# Patient Record
Sex: Female | Born: 1970 | Hispanic: Yes | Marital: Single | Smoking: Never smoker
Health system: Southern US, Community
[De-identification: ages and names within clinical notes are randomized; demographics above are authoritative.]

## PROBLEM LIST (undated history)

## (undated) ENCOUNTER — Ambulatory Visit: Admission: EM | Payer: Self-pay

## (undated) DIAGNOSIS — F419 Anxiety disorder, unspecified: Secondary | ICD-10-CM

## (undated) DIAGNOSIS — E785 Hyperlipidemia, unspecified: Secondary | ICD-10-CM

## (undated) HISTORY — PX: OTHER SURGICAL HISTORY: SHX169

---

## 2020-08-21 ENCOUNTER — Ambulatory Visit (INDEPENDENT_AMBULATORY_CARE_PROVIDER_SITE_OTHER): Payer: Self-pay | Admitting: Primary Care

## 2020-11-05 ENCOUNTER — Ambulatory Visit (INDEPENDENT_AMBULATORY_CARE_PROVIDER_SITE_OTHER): Payer: 59

## 2020-11-05 ENCOUNTER — Ambulatory Visit
Admission: EM | Admit: 2020-11-05 | Discharge: 2020-11-05 | Disposition: A | Discharge status: Home / Self Care | Payer: 59

## 2020-11-05 ENCOUNTER — Encounter: Payer: Self-pay | Admitting: Emergency Medicine

## 2020-11-05 ENCOUNTER — Other Ambulatory Visit: Payer: Self-pay

## 2020-11-05 DIAGNOSIS — M25572 Pain in left ankle and joints of left foot: Secondary | ICD-10-CM

## 2020-11-05 DIAGNOSIS — S93402A Sprain of unspecified ligament of left ankle, initial encounter: Secondary | ICD-10-CM

## 2020-11-05 HISTORY — DX: Anxiety disorder, unspecified: F41.9

## 2020-11-05 MED ORDER — IBUPROFEN 600 MG PO TABS: 600.0000 mg | ORAL_TABLET | Freq: Four times a day (QID) | ORAL | 0 refills | Status: DC | PRN

## 2020-11-05 NOTE — ED Triage Notes (Signed)
Stepped in hole with left foot. Rolled left ankle and twisted left knee. Denies feeling a pop. Was able to walk on it after. States most pain is concentrated in ankle. Mild swelling around ankle, bony prominences still present, no obvious injury or bruising.

## 2020-11-05 NOTE — ED Provider Notes (Signed)
[facility]    CSN: [id number] Arrival date & time: 11/05/20  1503      History   Chief Complaint Chief Complaint  Patient presents with   Ankle Injury    HPI Sheila Zhang is a 50 y.o. female.   Sheila Zhang is a 50 y.o. female patient that complains of pain in the lateral aspect of the left ankle with radiation to knee after a twisting injury. Onset of symptoms was around 2 pm earlier today. Patient reports that she stepped in a hole and rolled her ankle. She also twisted her knee. The patient describes pain as throbbing and tingling. Pain severity now is 6 /10. Pain is aggravated by movement and weight bearing. Pain is alleviated by rest. She denies any numbness, tingling, weakness, loss of sensation or loss of motion of the extremity. She is able to ambulate but weight bearing increases the pain. The patient denies other injuries.  Care prior to arrival consisted of acetaminophen, with minimal relief.     Past Medical History:  Diagnosis Date   Anxiety     There are no problems to display for this patient.   History reviewed. No pertinent surgical history.  OB History   No obstetric history on file.      Home Medications    Prior to Admission medications   Medication Sig Start Date End Date Taking? Authorizing Provider  buPROPion (WELLBUTRIN) 100 MG tablet Take 100 mg by mouth 2 (two) times daily.   Yes [provider]  ibuprofen (ADVIL) 600 MG tablet Take 1 tablet (600 mg total) by mouth every 6 (six) hours as needed. 11/05/20  Yes Lurline Idol, FNP    Family History History reviewed. No pertinent family history.  Social History Social History   Tobacco Use   Smoking status: Never   Smokeless tobacco: Never     Allergies   Patient has no known allergies.   Review of Systems Review of Systems  Musculoskeletal:  Positive for arthralgias and gait problem.  All other systems reviewed and are  negative.   Physical Exam Triage Vital Signs ED Triage Vitals [11/05/20 1640]  Enc Vitals Group     BP 138/67     Pulse Rate 71     Resp 16     Temp 98 F (36.7 C)     Temp Source Oral     SpO2 97 %     Weight      Height      Head Circumference      Peak Flow      Pain Score 5     Pain Loc      Pain Edu?      Excl. in GC?    No data found.  Updated Vital Signs BP 138/67 (BP Location: Left Arm)   Pulse 71   Temp 98 F (36.7 C) (Oral)   Resp 16   LMP 11/05/2020   SpO2 97%   Visual Acuity Right Eye Distance:   Left Eye Distance:   Bilateral Distance:    Right Eye Near:   Left Eye Near:    Bilateral Near:     Physical Exam Vitals reviewed.  Constitutional:      General: She is not in acute distress.    Appearance: Normal appearance. She is not toxic-appearing.  HENT:     Head: Normocephalic.  Cardiovascular:     Rate and Rhythm: Normal rate.  Pulmonary:     Effort: Pulmonary effort  is normal.  Musculoskeletal:        General: Normal range of motion.     Cervical back: Normal range of motion and neck supple.     Left knee: No swelling, deformity, effusion, erythema or bony tenderness. Normal range of motion. No tenderness. Normal alignment.     Left ankle: No swelling or deformity. Tenderness present. No lateral malleolus tenderness. Normal range of motion.     Left Achilles Tendon: Normal.     Left foot: Normal.  Skin:    General: Skin is warm and dry.  Neurological:     General: No focal deficit present.     Mental Status: She is alert and oriented to person, place, and time.     UC Treatments / Results  Labs (all labs ordered are listed, but only abnormal results are displayed) Labs Reviewed - No data to display  EKG   Radiology DG Ankle Complete Left  Result Date: 11/05/2020 CLINICAL DATA:  50 year old female with fall and trauma to the left lower extremity. EXAM: LEFT ANKLE COMPLETE - 3+ VIEW; LEFT KNEE - COMPLETE 4+ VIEW COMPARISON:   None. FINDINGS: There is no acute fracture or dislocation. The bones are osteopenic. 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 No joint effusion. The soft tissues are unremarkable. IMPRESSION: No acute fracture or dislocation. Electronically Signed   By: Elgie Collard M.D.   On: 11/05/2020 17:33   DG Knee Complete 4 Views Left  Result Date: 11/05/2020 CLINICAL DATA:  50 year old female with fall and trauma to the left lower extremity. EXAM: LEFT ANKLE COMPLETE - 3+ VIEW; LEFT KNEE - COMPLETE 4+ VIEW COMPARISON:  None. FINDINGS: There is no acute fracture or dislocation. The bones are osteopenic. 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 No joint effusion. The soft tissues are unremarkable. IMPRESSION: No acute fracture or dislocation. Electronically Signed   By: Elgie Collard M.D.   On: 11/05/2020 17:33    Procedures Procedures (including critical care time)  Medications Ordered in UC Medications - No data to display  Initial Impression / Assessment and Plan / UC Course  I have reviewed the triage vital signs and the nursing notes.  Pertinent labs & imaging results that were available during my care of the patient were reviewed by me and considered in my medical decision making (see chart for details).  Clinical Course as of 11/05/20 1817  Wed Nov 05, 2020  1802 DG Ankle Complete Left [SM]  1802 DG Knee Complete 4 Views Left [SM]    Clinical Course User Index [SM] Lurline Idol, FNP    50 yo female presenting with left ankle and left knee pain after a twisting injury that occurred around 2 pm today. XR negative for any acute fracture or abnormality. Likely mild sprain. Expected course discussed. Advised RICE. Ankle splint applied in clinic. Ibuprofen as needed for pain. Follow-up with ortho if needed.   Today's evaluation has revealed no signs of a dangerous process. Discussed diagnosis with patient and/or guardian. Patient and/or guardian aware of their  diagnosis, possible red flag symptoms to watch out for and need for close follow up. Patient and/or guardian understands verbal and written discharge instructions. Patient and/or guardian comfortable with plan and disposition.  Patient and/or guardian has a clear mental status at this time, good insight into illness (after discussion and teaching) and has clear judgment to make decisions regarding their care  This care was provided during an unprecedented National Emergency due  to the Novel Coronavirus (COVID-19) pandemic. COVID-19 infections and transmission risks place heavy strains on healthcare resources.  As this pandemic evolves, our facility, providers, and staff strive to respond fluidly, to remain operational, and to provide care relative to available resources and information. Outcomes are unpredictable and treatments are without well-defined guidelines. Further, the impact of COVID-19 on all aspects of urgent care, including the impact to patients seeking care for reasons other than COVID-19, is unavoidable during this national emergency. At this time of the global pandemic, management of patients has significantly changed, even for non-COVID positive patients given high local and regional COVID volumes at this time requiring high healthcare system and resource utilization. The standard of care for management of both COVID suspected and non-COVID suspected patients continues to change rapidly at the local, regional, national, and global levels. This patient was worked up and treated to the best available but ever changing evidence and resources available at this current time.   Documentation was completed with the aid of voice recognition software. Transcription may contain typographical errors. Final Clinical Impressions(s) / UC Diagnoses   Final diagnoses:  Sprain of left ankle, unspecified ligament, initial encounter   Discharge Instructions   None    ED Prescriptions     Medication Sig  Dispense Auth. Provider   ibuprofen (ADVIL) 600 MG tablet Take 1 tablet (600 mg total) by mouth every 6 (six) hours as needed. 30 tablet Lurline Idol, FNP      PDMP not reviewed this encounter.   Lurline Idol, [place] 11/05/20 [phone]

## 2020-11-11 ENCOUNTER — Ambulatory Visit
Admission: EM | Admit: 2020-11-11 | Discharge: 2020-11-11 | Disposition: A | Discharge status: Home / Self Care | Payer: 59 | Attending: Urgent Care | Admitting: Urgent Care

## 2020-11-11 ENCOUNTER — Other Ambulatory Visit: Payer: Self-pay

## 2020-11-11 DIAGNOSIS — M79605 Pain in left leg: Secondary | ICD-10-CM | POA: Diagnosis not present

## 2020-11-11 DIAGNOSIS — M25572 Pain in left ankle and joints of left foot: Secondary | ICD-10-CM

## 2020-11-11 MED ORDER — TIZANIDINE HCL 4 MG PO TABS: 4.0000 mg | ORAL_TABLET | Freq: Every day | ORAL | 0 refills | Status: DC

## 2020-11-11 MED ORDER — NAPROXEN 500 MG PO TABS: 500.0000 mg | ORAL_TABLET | Freq: Two times a day (BID) | ORAL | 0 refills | Status: DC

## 2020-11-11 NOTE — ED Triage Notes (Signed)
Pt c/o pain and swelling to lower left leg s/p work injury that occurred approx 1 week ago. States her foot twisted right while leg twisted left. States she came to our UC after initial injury and we performed x-ray with pain medication for dc. States pain is not getting better and keeping her up at night. States foot tingles and has shooting achy pain moving up her leg.

## 2020-11-11 NOTE — ED Provider Notes (Signed)
[facility]   MRN: [id number] DOB: 03/15/1971  Subjective:   Sheila Zhang is a 50 y.o. female presenting for 1 week history of persistent left leg pain between her thigh all the way down to her ankle.  Patient suffered a work injury and was seen at our urgent care.  Had negative imaging.  Refer to the details regarding the nature of her injury from previous visit.  Has been using ibuprofen and the ASO wrap.  Feels like its not helping.  Denies any new injuries.  No warmth, erythema, bony deformity.  No current facility-administered medications for this encounter.  Current Outpatient Medications:    buPROPion (WELLBUTRIN) 100 MG tablet, Take 100 mg by mouth 2 (two) times daily., Disp: , Rfl:    ibuprofen (ADVIL) 600 MG tablet, Take 1 tablet (600 mg total) by mouth every 6 (six) hours as needed., Disp: 30 tablet, Rfl: 0   No Known Allergies  Past Medical History:  Diagnosis Date   Anxiety      History reviewed. No pertinent surgical history.  History reviewed. No pertinent family history.  Social History   Tobacco Use   Smoking status: Never   Smokeless tobacco: Never  Substance Use Topics   Alcohol use: Never   Drug use: Never    ROS   Objective:   Vitals: BP 129/86 (BP Location: Left Arm)   Pulse 74   Temp 98.1 F (36.7 C) (Oral)   Resp 18   LMP 11/05/2020 (Approximate)   SpO2 97%   Physical Exam Constitutional:      General: She is not in acute distress.    Appearance: Normal appearance. She is well-developed. She is not ill-appearing, toxic-appearing or diaphoretic.  HENT:     Head: Normocephalic and atraumatic.     Nose: Nose normal.     Mouth/Throat:     Mouth: Mucous membranes are moist.     Pharynx: Oropharynx is clear.  Eyes:     General: No scleral icterus.       Right eye: No discharge.        Left eye: No discharge.     Extraocular Movements: Extraocular movements intact.     Conjunctiva/sclera: Conjunctivae normal.      Pupils: Pupils are equal, round, and reactive to light.  Cardiovascular:     Rate and Rhythm: Normal rate.  Pulmonary:     Effort: Pulmonary effort is normal.  Musculoskeletal:     Left upper leg: Tenderness (Along the lateral distal thigh) present. No swelling, edema, deformity, lacerations or bony tenderness.     Left knee: No swelling, deformity, effusion, erythema, ecchymosis, lacerations, bony tenderness or crepitus. Normal range of motion. No tenderness. Normal alignment and normal patellar mobility.     Left lower leg: Tenderness (Anterior lateral over muscular areas) present. No swelling, deformity, lacerations or bony tenderness. No edema.     Left ankle: No swelling, deformity, ecchymosis or lacerations. No tenderness. Normal range of motion.     Left Achilles Tendon: No tenderness or defects. Thompson's test negative.     Left foot: Normal range of motion and normal capillary refill. No swelling, deformity, laceration, tenderness, bony tenderness or crepitus.  Skin:    General: Skin is warm and dry.  Neurological:     General: No focal deficit present.     Mental Status: She is alert and oriented to person, place, and time.     Motor: No weakness.     Coordination: Coordination normal.  Gait: Gait normal.     Deep Tendon Reflexes: Reflexes normal.  Psychiatric:        Mood and Affect: Mood normal.        Behavior: Behavior normal.        Thought Content: Thought content normal.        Judgment: Judgment normal.    Left ankle wrapped using 4" Ace wrap in figure-8 method.   Assessment and Plan :   PDMP not reviewed this encounter.  1. Left leg pain   2. Acute left ankle pain     Will defer imaging as she had completely normal x-rays.  Recommended stopping the use of the ASO brace and use an Ace wrap instead.  We will switch her from ibuprofen to naproxen.  Use tizanidine as well. Counseled patient on potential for adverse effects with medications  prescribed/recommended today, ER and return-to-clinic precautions discussed, patient verbalized understanding.    Wallis Bamberg, PA-C 11/11/20 1211

## 2021-01-05 ENCOUNTER — Other Ambulatory Visit: Payer: Self-pay

## 2021-01-05 ENCOUNTER — Encounter (INDEPENDENT_AMBULATORY_CARE_PROVIDER_SITE_OTHER): Payer: Self-pay | Admitting: Primary Care

## 2021-01-05 ENCOUNTER — Ambulatory Visit (INDEPENDENT_AMBULATORY_CARE_PROVIDER_SITE_OTHER): Payer: 59 | Admitting: Primary Care

## 2021-01-05 VITALS — BP 116/77 | HR 74 | Temp 97.5°F | Ht 64.0 in | Wt 211.2 lb

## 2021-01-05 DIAGNOSIS — R7303 Prediabetes: Secondary | ICD-10-CM

## 2021-01-05 DIAGNOSIS — Z1211 Encounter for screening for malignant neoplasm of colon: Secondary | ICD-10-CM

## 2021-01-05 DIAGNOSIS — M25572 Pain in left ankle and joints of left foot: Secondary | ICD-10-CM

## 2021-01-05 DIAGNOSIS — N912 Amenorrhea, unspecified: Secondary | ICD-10-CM

## 2021-01-05 DIAGNOSIS — Z87898 Personal history of other specified conditions: Secondary | ICD-10-CM

## 2021-01-05 DIAGNOSIS — Z09 Encounter for follow-up examination after completed treatment for conditions other than malignant neoplasm: Secondary | ICD-10-CM

## 2021-01-05 DIAGNOSIS — F4323 Adjustment disorder with mixed anxiety and depressed mood: Secondary | ICD-10-CM | POA: Diagnosis not present

## 2021-01-05 DIAGNOSIS — E6609 Other obesity due to excess calories: Secondary | ICD-10-CM

## 2021-01-05 DIAGNOSIS — Z79899 Other long term (current) drug therapy: Secondary | ICD-10-CM

## 2021-01-05 DIAGNOSIS — N393 Stress incontinence (female) (male): Secondary | ICD-10-CM

## 2021-01-05 DIAGNOSIS — Z23 Encounter for immunization: Secondary | ICD-10-CM | POA: Diagnosis not present

## 2021-01-05 DIAGNOSIS — Z7689 Persons encountering health services in other specified circumstances: Secondary | ICD-10-CM

## 2021-01-05 DIAGNOSIS — Z6836 Body mass index (BMI) 36.0-36.9, adult: Secondary | ICD-10-CM

## 2021-01-05 LAB — POCT GLYCOSYLATED HEMOGLOBIN (HGB A1C): HbA1c, POC (prediabetic range): 5.7 % (ref 5.7–6.4)

## 2021-01-05 MED ORDER — TIZANIDINE HCL 4 MG PO TABS: 4.0000 mg | ORAL_TABLET | Freq: Every day | ORAL | 0 refills | Status: DC

## 2021-01-05 MED ORDER — BUPROPION HCL 100 MG PO TABS: 100.0000 mg | ORAL_TABLET | Freq: Every day | ORAL | 1 refills | Status: DC

## 2021-01-05 NOTE — Progress Notes (Signed)
New Patient Office Visit  Subjective:  Patient ID: Sheila Zhang, female    DOB: [birth year]  Age: 50 y.o. MRN: [id number]  CC:  Chief Complaint  Patient presents with   New Patient (Initial Visit)    anxiety    HPI Sheila Zhang is a 50 year old female who presents for establishment of care. Recently seen in urgent care on 11/11/20 for Left leg pain Acute left ankle pain. Patient suffered a work injury. She had negative imaging on previous visit in the ED on 11/05/20 reports that she stepped in a hole and rolled her ankle. Advised to : Follow up with [facility] (Specialist); As needed. Relocated from [place] and was followed by psychiatrist and counseling every 2 weeks.  Past Medical History:  Diagnosis Date   Anxiety     No past surgical history on file.  Mother is a diabetic  Gestational diabetes with her last child  in 2013  Social History   Socioeconomic History   Marital status: Single    Spouse name: Not on file   Number of children: Not on file   Years of education: Not on file   Highest education level: Not on file  Occupational History   Not on file  Tobacco Use   Smoking status: Never   Smokeless tobacco: Never  Substance and Sexual Activity   Alcohol use: Never   Drug use: Never   Sexual activity: Not on file  Other Topics Concern   Not on file  Social History Narrative   Not on file   Social Determinants of Health   Financial Resource Strain: Not on file  Food Insecurity: Not on file  Transportation Needs: Not on file  Physical Activity: Not on file  Stress: Not on file  Social Connections: Not on file  Intimate Partner Violence: Not on file    ROS Review of Systems  Musculoskeletal:        Left ankle pain and swelling still has pain and in her calf.  All other systems reviewed and are negative.  Objective:  BP 116/77 (BP Location: Right Arm, Patient Position: Sitting, Cuff Size: Large)   Pulse 74   Temp (!) 97.5 F (36.4  C) (Temporal)   Ht '5\' 4"'  (1.626 m)   Wt 211 lb 3.2 oz (95.8 kg)   SpO2 96%   BMI 36.25 kg/m    Physical Exam General: Obese female ,No apparent distress. Eyes: Extraocular eye movements intact, pupils equal and round. Neck: Supple, trachea midline. Thyroid: No enlargement, mobile without fixation, no tenderness. Cardiovascular: Regular rhythm and rate, no murmur, normal radial pulses. Respiratory: Normal respiratory effort, clear to auscultation. Gastrointestinal: Normal pitch active bowel sounds, nontender abdomen without distention or appreciable hepatomegaly. Neurologic: no tremor Musculoskeletal: Normal muscle tone, no tenderness on palpation of tibia, no excessive thoracic kyphosis. Skin: Appropriate warmth, no visible rash. Mental status: Alert, conversant, speech clear, thought logical, appropriate mood and affect, no hallucinations or delusions evident. Hematologic/lymphatic: No cervical adenopathy, no visible ecchymoses.   Assessment & Plan:  Machele was seen today for new patient (initial visit).  Diagnoses and all orders for this visit:  Hospital discharge follow-up Injury at work seen in Urgent Care and ED recommended ortho f/u with [facility] (Specialist); As needed  Encounter to establish care Establish care with PCP  Adjustment disorder with mixed anxiety and depressed mood Requesting refer to psyche and counseling . Refilled Wellbutrin.  Class 2 obesity due to excess calories without serious comorbidity  with body mass index (BMI) of 36.0 to 36.9 in adult Obesity is 30-39 indicating an excess in caloric intake or underlining conditions. This may lead to other co-morbidities. Lifestyle modifications of diet and exercise may reduce obesity.   Discussed diet and exercise for person with BMI >25. Instructed: You must burn more calories than you eat. Losing 5 percent of your body weight should be considered a success. In the longer term, losing more than 15  percent of your body weight and staying at this weight is an extremely good result. However, keep in mind that even losing 5 percent of your body weight leads to important health benefits, so try not to get discouraged if you're not able to lose more than this. Will recheck weight in 3-6 months.  TSH + free T4  Screening for colon cancer -     Cologuard  History of prediabetes -     POCT glycosylated hemoglobin (Hb A1C)  Amenorrhea CBC- heavy painful menstrual cycles last 7 day. No cycle in 2 months (tubal ligation)  Adjustment disorder with mixed anxiety and depressed mood Refilled Wellbutrin 160m daily per patient  -     Ambulatory referral to Psychiatry  Screening for colon cancer -     Cologuard  Medication management -     [id number]+EGFR  Pain of joint of left ankle and foot Pain is 5/10 increase with walking and back of calf feel like something is pulling and radiates to thigh while sleeping. Interrupting sleep. Tizanidine 448mas needed Refer to ortho  Prediabetes 5.7-6.4 consider prediabetes per ADA guidelines . Monitor foods that are high in carbohydrates are the following rice, potatoes, breads, sugars, and pastas.  Reduction in the intake (eating) will assist in lowering your blood sugars.   Need for immunization against influenza Completed health maintenance   Other orders -     buPROPion (WELLBUTRIN) 100 MG tablet; Take 1 tablet (100 mg total) by mouth daily.   Outpatient Encounter Medications as of 01/05/2021  Medication Sig   [DISCONTINUED] buPROPion (WELLBUTRIN) 100 MG tablet Take 100 mg by mouth 2 (two) times daily.   buPROPion (WELLBUTRIN) 100 MG tablet Take 1 tablet (100 mg total) by mouth daily.   [DISCONTINUED] ibuprofen (ADVIL) 600 MG tablet Take 1 tablet (600 mg total) by mouth every 6 (six) hours as needed.   [DISCONTINUED] naproxen (NAPROSYN) 500 MG tablet Take 1 tablet (500 mg total) by mouth 2 (two) times daily with a meal.   [DISCONTINUED] tiZANidine  (ZANAFLEX) 4 MG tablet Take 1 tablet (4 mg total) by mouth at bedtime.   No facility-administered encounter medications on file as of 01/05/2021.    Follow-up: Return for schedule pap.   MiKerin PernaNP

## 2021-01-05 NOTE — Patient Instructions (Addendum)
Calorie Counting for Weight Loss Calories are units of energy. Your body needs a certain number of calories from food to keep going throughout the day. When you eat or drink more calories than your body needs, your body stores the extra calories mostly as fat. When you eat or drink fewer calories than your body needs, your body burns fat to get the energy it needs. Calorie counting means keeping track of how many calories you eat and drink each day. Calorie counting can be helpful if you need to lose weight. If you eat fewer calories than your body needs, you should lose weight. Ask your health care provider what a healthy weight is for you. For calorie counting to work, you will need to eat the right number of calories each day to lose a healthy amount of weight per week. A dietitian can help you figure out how many calories you need in a day and will suggest ways to reach your calorie goal. A healthy amount of weight to lose each week is usually 1-2 lb (0.5-0.9 kg). This usually means that your daily calorie intake should be reduced by 500-750 calories. Eating 1,200-1,500 calories a day can help most women lose weight. Eating 1,500-1,800 calories a day can help most men lose weight. What do I need to know about calorie counting? Work with your health care provider or dietitian to determine how many calories you should get each day. To meet your daily calorie goal, you will need to: Find out how many calories are in each food that you would like to eat. Try to do this before you eat. Decide how much of the food you plan to eat. Keep a food log. Do this by writing down what you ate and how many calories it had. To successfully lose weight, it is important to balance calorie counting with a healthy lifestyle that includes regular activity. Where do I find calorie information? The number of calories in a food can be found on a Nutrition Facts label. If a food does not have a Nutrition Facts label, try to  look up the calories online or ask your dietitian for help. Remember that calories are listed per serving. If you choose to have more than one serving of a food, you will have to multiply the calories per serving by the number of servings you plan to eat. For example, the label on a package of bread might say that a serving size is 1 slice and that there are 90 calories in a serving. If you eat 1 slice, you will have eaten 90 calories. If you eat 2 slices, you will have eaten 180 calories. How do I keep a food log? After each time that you eat, record the following in your food log as soon as possible: What you ate. Be sure to include toppings, sauces, and other extras on the food. How much you ate. This can be measured in cups, ounces, or number of items. How many calories were in each food and drink. The total number of calories in the food you ate. Keep your food log near you, such as in a pocket-sized notebook or on an app or website on your mobile phone. Some programs will calculate calories for you and show you how many calories you have left to meet your daily goal. What are some portion-control tips? Know how many calories are in a serving. This will help you know how many servings you can have of a certain food.  Use a measuring cup to measure serving sizes. You could also try weighing out portions on a kitchen scale. With time, you will be able to estimate serving sizes for some foods. Take time to put servings of different foods on your favorite plates or in your favorite bowls and cups so you know what a serving looks like. Try not to eat straight from a food's packaging, such as from a bag or box. Eating straight from the package makes it hard to see how much you are eating and can lead to overeating. Put the amount you would like to eat in a cup or on a plate to make sure you are eating the right portion. Use smaller plates, glasses, and bowls for smaller portions and to prevent  overeating. Try not to multitask. For example, avoid watching TV or using your computer while eating. If it is time to eat, sit down at a table and enjoy your food. This will help you recognize when you are full. It will also help you be more mindful of what and how much you are eating. What are tips for following this plan? Reading food labels Check the calorie count compared with the serving size. The serving size may be smaller than what you are used to eating. Check the source of the calories. Try to choose foods that are high in protein, fiber, and vitamins, and low in saturated fat, trans fat, and sodium. Shopping Read nutrition labels while you shop. This will help you make healthy decisions about which foods to buy. Pay attention to nutrition labels for low-fat or fat-free foods. These foods sometimes have the same number of calories or more calories than the full-fat versions. They also often have added sugar, starch, or salt to make up for flavor that was removed with the fat. Make a grocery list of lower-calorie foods and stick to it. Cooking Try to cook your favorite foods in a healthier way. For example, try baking instead of frying. Use low-fat dairy products. Meal planning Use more fruits and vegetables. One-half of your plate should be fruits and vegetables. Include lean proteins, such as chicken, [place], and fish. Lifestyle Each week, aim to do one of the following: 150 minutes of moderate exercise, such as walking. 75 minutes of vigorous exercise, such as running. General information Know how many calories are in the foods you eat most often. This will help you calculate calorie counts faster. Find a way of tracking calories that works for you. Get creative. Try different apps or programs if writing down calories does not work for you. What foods should I eat?  Eat nutritious foods. It is better to have a nutritious, high-calorie food, such as an avocado, than a food with  few nutrients, such as a bag of potato chips. Use your calories on foods and drinks that will fill you up and will not leave you hungry soon after eating. Examples of foods that fill you up are nuts and nut butters, vegetables, lean proteins, and high-fiber foods such as whole grains. High-fiber foods are foods with more than 5 g of fiber per serving. Pay attention to calories in drinks. Low-calorie drinks include water and unsweetened drinks. The items listed above may not be a complete list of foods and beverages you can eat. Contact a dietitian for more information. What foods should I limit? Limit foods or drinks that are not good sources of vitamins, minerals, or protein or that are high in unhealthy fats. These include:  Candy. Other sweets. Sodas, specialty coffee drinks, alcohol, and juice. The items listed above may not be a complete list of foods and beverages you should avoid. Contact a dietitian for more information. How do I count calories when eating out? Pay attention to portions. Often, portions are much larger when eating out. Try these tips to keep portions smaller: Consider sharing a meal instead of getting your own. If you get your own meal, eat only half of it. Before you start eating, ask for a container and put half of your meal into it. When available, consider ordering smaller portions from the menu instead of full portions. Pay attention to your food and drink choices. Knowing the way food is cooked and what is included with the meal can help you eat fewer calories. If calories are listed on the menu, choose the lower-calorie options. Choose dishes that include vegetables, fruits, whole grains, low-fat dairy products, and lean proteins. Choose items that are boiled, broiled, grilled, or steamed. Avoid items that are buttered, battered, fried, or served with cream sauce. Items labeled as crispy are usually fried, unless stated otherwise. Choose water, low-fat milk,  unsweetened iced tea, or other drinks without added sugar. If you want an alcoholic beverage, choose a lower-calorie option, such as a glass of wine or light beer. Ask for dressings, sauces, and syrups on the side. These are usually high in calories, so you should limit the amount you eat. If you want a salad, choose a garden salad and ask for grilled meats. Avoid extra toppings such as bacon, cheese, or fried items. Ask for the dressing on the side, or ask for olive oil and vinegar or lemon to use as dressing. Estimate how many servings of a food you are given. Knowing serving sizes will help you be aware of how much food you are eating at restaurants. Where to find more information Centers for Disease Control and Prevention: FootballExhibition.com.br U.[place]. Department of Agriculture: WrestlingReporter.dk Summary Calorie counting means keeping track of how many calories you eat and drink each day. If you eat fewer calories than your body needs, you should lose weight. A healthy amount of weight to lose per week is usually 1-2 lb (0.5-0.9 kg). This usually means reducing your daily calorie intake by 500-750 calories. The number of calories in a food can be found on a Nutrition Facts label. If a food does not have a Nutrition Facts label, try to look up the calories online or ask your dietitian for help. Use smaller plates, glasses, and bowls for smaller portions and to prevent overeating. Use your calories on foods and drinks that will fill you up and not leave you hungry shortly after a meal. This information is not intended to replace advice given to you by your health care provider. Make sure you discuss any questions you have with your health care provider. Document Revised: 05/10/2019 Document Reviewed: 05/10/2019 Elsevier Patient Education  2022 Elsevier Inc. Kegel Exercises Kegel exercises can help strengthen your pelvic floor muscles. The pelvic floor is a group of muscles that support your rectum, small intestine,  and bladder. In females, pelvic floor muscles also help support the womb (uterus). These muscles help you control the flow of urine and stool. Kegel exercises are painless and simple, and they do not require any equipment. Your provider may suggest Kegel exercises to: Improve bladder and bowel control. Improve sexual response. Improve weak pelvic floor muscles after surgery to remove the uterus (hysterectomy) or pregnancy (females). Improve weak  pelvic floor muscles after prostate gland removal or surgery (males). Kegel exercises involve squeezing your pelvic floor muscles, which are the same muscles you squeeze when you try to stop the flow of urine or keep from passing gas. The exercises can be done while sitting, standing, or lying down, but it is best to vary your position. Exercises How to do Kegel exercises: Squeeze your pelvic floor muscles tight. You should feel a tight lift in your rectal area. If you are a female, you should also feel a tightness in your vaginal area. Keep your stomach, buttocks, and legs relaxed. Hold the muscles tight for up to 10 seconds. Breathe normally. Relax your muscles. Repeat as told by your health care provider. Repeat this exercise daily as told by your health care provider. Continue to do this exercise for at least 4-6 weeks, or for as long as told by your health care provider. You may be referred to a physical therapist who can help you learn more about how to do Kegel exercises. Depending on your condition, your health care provider may recommend: Varying how long you squeeze your muscles. Doing several sets of exercises every day. Doing exercises for several weeks. Making Kegel exercises a part of your regular exercise routine. This information is not intended to replace advice given to you by your health care provider. Make sure you discuss any questions you have with your health care provider. Document Revised: 03/19/2020 Document Reviewed:  11/16/2017 Elsevier Patient Education  2022 Elsevier Inc Use our kegel weights for 15 minutes daily to strengthen your vaginal muscles and reach your personal goals

## 2021-01-06 LAB — CMP14+EGFR
ALT: 15 IU/L (ref 0–32)
AST: 19 IU/L (ref 0–40)
Albumin/Globulin Ratio: 1.3 (ref 1.2–2.2)
Albumin: 3.9 g/dL (ref 3.8–4.8)
Alkaline Phosphatase: 108 IU/L (ref 44–121)
BUN/Creatinine Ratio: 11 (ref 9–23)
BUN: 8 mg/dL (ref 6–24)
Bilirubin Total: <0.2 mg/dL (ref 0.0–1.2)
CO2: 23 mmol/L (ref 20–29)
Calcium: 9.1 mg/dL (ref 8.7–10.2)
Chloride: 103 mmol/L (ref 96–106)
Creatinine, Ser: 0.74 mg/dL (ref 0.57–1.00)
Globulin, Total: 3.1 g/dL (ref 1.5–4.5)
Glucose: 82 mg/dL (ref 65–99)
Potassium: 4.6 mmol/L (ref 3.5–5.2)
Sodium: 137 mmol/L (ref 134–144)
Total Protein: 7 g/dL (ref 6.0–8.5)
eGFR: 100 mL/min/{1.73_m2} (ref >59)

## 2021-01-06 LAB — LIPID PANEL
Chol/HDL Ratio: 4.2 ratio (ref 0.0–4.4)
Cholesterol, Total: 241 mg/dL — ABNORMAL HIGH (ref 100–199)
HDL: 57 mg/dL (ref >39)
LDL Chol Calc (NIH): 137 mg/dL — ABNORMAL HIGH (ref 0–99)
Triglycerides: 264 mg/dL — ABNORMAL HIGH (ref 0–149)
VLDL Cholesterol Cal: 47 mg/dL — ABNORMAL HIGH (ref 5–40)

## 2021-01-06 LAB — CBC WITH DIFFERENTIAL/PLATELET
Basophils Absolute: 0.1 10*3/uL (ref 0.0–0.2)
Basos: 1 %
EOS (ABSOLUTE): 0.1 10*3/uL (ref 0.0–0.4)
Eos: 1 %
Hematocrit: 38.7 % (ref 34.0–46.6)
Hemoglobin: 12.9 g/dL (ref 11.1–15.9)
Immature Grans (Abs): 0 10*3/uL (ref 0.0–0.1)
Immature Granulocytes: 0 %
Lymphocytes Absolute: 3.3 10*3/uL — ABNORMAL HIGH (ref 0.7–3.1)
Lymphs: 35 %
MCH: 28.7 pg (ref 26.6–33.0)
MCHC: 33.3 g/dL (ref 31.5–35.7)
MCV: 86 fL (ref 79–97)
Monocytes Absolute: 0.7 10*3/uL (ref 0.1–0.9)
Monocytes: 7 %
Neutrophils Absolute: 5.1 10*3/uL (ref 1.4–7.0)
Neutrophils: 56 %
Platelets: 401 10*3/uL (ref 150–450)
RBC: 4.49 x10E6/uL (ref 3.77–5.28)
RDW: 13.4 % (ref 11.7–15.4)
WBC: 9.3 10*3/uL (ref 3.4–10.8)

## 2021-01-06 LAB — TSH+FREE T4
Free T4: 0.81 ng/dL — ABNORMAL LOW (ref 0.82–1.77)
TSH: 1.03 u[IU]/mL (ref 0.450–4.500)

## 2021-01-09 ENCOUNTER — Other Ambulatory Visit (INDEPENDENT_AMBULATORY_CARE_PROVIDER_SITE_OTHER): Payer: Self-pay | Admitting: Primary Care

## 2021-01-09 DIAGNOSIS — E782 Mixed hyperlipidemia: Secondary | ICD-10-CM

## 2021-01-09 MED ORDER — ATORVASTATIN CALCIUM 80 MG PO TABS
80.0000 mg | ORAL_TABLET | Freq: Every day | ORAL | 3 refills | Status: DC
Start: 2021-01-09 — End: 2021-12-31

## 2021-01-19 ENCOUNTER — Ambulatory Visit (HOSPITAL_COMMUNITY)
Admission: EM | Admit: 2021-01-19 | Discharge: 2021-01-19 | Disposition: A | Discharge status: Home / Self Care | Payer: 59 | Attending: Emergency Medicine | Admitting: Emergency Medicine

## 2021-01-19 ENCOUNTER — Other Ambulatory Visit: Payer: Self-pay

## 2021-01-19 ENCOUNTER — Encounter (HOSPITAL_COMMUNITY): Payer: Self-pay | Admitting: Emergency Medicine

## 2021-01-19 DIAGNOSIS — N939 Abnormal uterine and vaginal bleeding, unspecified: Secondary | ICD-10-CM | POA: Insufficient documentation

## 2021-01-19 LAB — CBC
HCT: 36.6 % (ref 36.0–46.0)
Hemoglobin: 11.8 g/dL — ABNORMAL LOW (ref 12.0–15.0)
MCH: 28.9 pg (ref 26.0–34.0)
MCHC: 32.2 g/dL (ref 30.0–36.0)
MCV: 89.7 fL (ref 80.0–100.0)
Platelets: 361 10*3/uL (ref 150–400)
RBC: 4.08 MIL/uL (ref 3.87–5.11)
RDW: 14.1 % (ref 11.5–15.5)
WBC: 10.2 10*3/uL (ref 4.0–10.5)
nRBC: 0 % (ref 0.0–0.2)

## 2021-01-19 LAB — POCT URINALYSIS DIPSTICK, ED / UC
Bilirubin Urine: NEGATIVE
Glucose, UA: NEGATIVE mg/dL
Ketones, ur: NEGATIVE mg/dL
Nitrite: NEGATIVE
Protein, ur: 30 mg/dL — AB
Specific Gravity, Urine: 1.02 (ref 1.005–1.030)
Urobilinogen, UA: 0.2 mg/dL (ref 0.0–1.0)
pH: 5.5 (ref 5.0–8.0)

## 2021-01-19 LAB — POC URINE PREG, ED: Preg Test, Ur: NEGATIVE

## 2021-01-19 MED ORDER — IBUPROFEN 600 MG PO TABS: 600.0000 mg | ORAL_TABLET | Freq: Four times a day (QID) | ORAL | 0 refills | Status: DC | PRN

## 2021-01-19 NOTE — ED Provider Notes (Signed)
[facility]    CSN: [id number] Arrival date & time: 01/19/21  1139      History   Chief Complaint Chief Complaint  Patient presents with   Vaginal Bleeding   Abdominal Pain    HPI Sheila Zhang is a 50 y.o. female.   Patient here for evaluation of lower abdominal pain and cramping and vaginal bleeding that has been ongoing since Thursday.  Reports that she had not had her period in approximately 3 months prior to Thursday.  Reports bleeding is heavier than her normal period and states that she is passing clots.  Reports going through 7-8 pads a day.  Reports taking Motrin and Tylenol with minimal symptom relief.  Reports having similar symptoms with periods when she was younger but states that she has not had any issues in years.  Denies any trauma, injury, or other precipitating event.  Denies any specific alleviating or aggravating factors.  Denies any fevers, chest pain, shortness of breath, N/V/D, numbness, tingling, weakness, or headaches.    The history is provided by the patient.  Vaginal Bleeding Associated symptoms: abdominal pain   Abdominal Pain Associated symptoms: vaginal bleeding    Past Medical History:  Diagnosis Date   Anxiety     There are no problems to display for this patient.   History reviewed. No pertinent surgical history.  OB History   No obstetric history on file.      Home Medications    Prior to Admission medications   Medication Sig Start Date End Date Taking? Authorizing Provider  ibuprofen (ADVIL) 600 MG tablet Take 1 tablet (600 mg total) by mouth every 6 (six) hours as needed. 01/19/21  Yes Ivette Loyal, NP  atorvastatin (LIPITOR) 80 MG tablet Take 1 tablet (80 mg total) by mouth daily. 01/09/21   Grayce Sessions, NP  buPROPion (WELLBUTRIN) 100 MG tablet Take 1 tablet (100 mg total) by mouth daily. 01/05/21   Grayce Sessions, NP  tiZANidine (ZANAFLEX) 4 MG tablet Take 1 tablet (4 mg total) by mouth at  bedtime. 01/05/21   Grayce Sessions, NP    Family History History reviewed. No pertinent family history.  Social History Social History   Tobacco Use   Smoking status: Never   Smokeless tobacco: Never  Substance Use Topics   Alcohol use: Never   Drug use: Never     Allergies   Escitalopram   Review of Systems Review of Systems  Gastrointestinal:  Positive for abdominal pain.  Genitourinary:  Positive for menstrual problem and vaginal bleeding.  All other systems reviewed and are negative.   Physical Exam Triage Vital Signs ED Triage Vitals  Enc Vitals Group     BP 01/19/21 1310 125/64     Pulse Rate 01/19/21 1310 77     Resp 01/19/21 1310 18     Temp 01/19/21 1310 98.2 F (36.8 C)     Temp src --      SpO2 01/19/21 1310 99 %     Weight --      Height --      Head Circumference --      Peak Flow --      Pain Score 01/19/21 1306 10     Pain Loc --      Pain Edu? --      Excl. in GC? --    No data found.  Updated Vital Signs BP 125/64   Pulse 77   Temp 98.2 F (36.8  C)   Resp 18   SpO2 99%   Visual Acuity Right Eye Distance:   Left Eye Distance:   Bilateral Distance:    Right Eye Near:   Left Eye Near:    Bilateral Near:     Physical Exam Vitals and nursing note reviewed.  Constitutional:      General: She is not in acute distress.    Appearance: Normal appearance. She is not ill-appearing, toxic-appearing or diaphoretic.  HENT:     Head: Normocephalic and atraumatic.  Eyes:     Conjunctiva/sclera: Conjunctivae normal.  Cardiovascular:     Rate and Rhythm: Normal rate.     Pulses: Normal pulses.  Pulmonary:     Effort: Pulmonary effort is normal.  Abdominal:     General: Abdomen is flat.     Tenderness: There is abdominal tenderness in the suprapubic area.  Genitourinary:    Comments: declines Musculoskeletal:        General: Normal range of motion.     Cervical back: Normal range of motion.  Skin:    General: Skin is warm and  dry.  Neurological:     General: No focal deficit present.     Mental Status: She is alert and oriented to person, place, and time.  Psychiatric:        Mood and Affect: Mood normal.     UC Treatments / Results  Labs (all labs ordered are listed, but only abnormal results are displayed) Labs Reviewed  CBC  POCT URINALYSIS DIPSTICK, ED / UC  POC URINE PREG, ED    EKG   Radiology No results found.  Procedures Procedures (including critical care time)  Medications Ordered in UC Medications - No data to display  Initial Impression / Assessment and Plan / UC Course  I have reviewed the triage vital signs and the nursing notes.  Pertinent labs & imaging results that were available during my care of the patient were reviewed by me and considered in my medical decision making (see chart for details).    Assessment negative for red flags or concerns.  Abnormal vaginal bleeding.  Urinalysis with hemoglobin, protein, and leukocytes.  Pregnancy test negative.  CBC obtained for hemoglobin.  We will follow-up with patient if lab results are abnormal.  Take ibuprofen 3 times a day as needed for pain.  May also take Tylenol.  May use heating pad or warm compresses for comfort.  Patient reports having an appointment with OB/GYN scheduled for Thursday and encouraged to keep follow-up.  Strict ED follow-up for any worsening symptoms. Final Clinical Impressions(s) / UC Diagnoses   Final diagnoses:  Abnormal vaginal bleeding     Discharge Instructions      You can take the Ibuprofen 3 times a day as needed for pain.  You can also take Tylenol as  needed.    You can use heat or warm compresses for comfort.   Follow up with your OB/GYN as scheduled.       ED Prescriptions     Medication Sig Dispense Auth. Provider   ibuprofen (ADVIL) 600 MG tablet Take 1 tablet (600 mg total) by mouth every 6 (six) hours as needed. 30 tablet Ivette Loyal, NP      PDMP not reviewed this  encounter.   Ivette Loyal, NP 01/19/21 [phone]

## 2021-01-19 NOTE — ED Triage Notes (Signed)
Pt is present today with vaginal bleeding, abdominal pain, and lower back pain. . Pt states that she is going through 7-8 pads daily. Pt states that she is also having light headed and dizziness. Pt states that bleeding started Thursday

## 2021-01-19 NOTE — Discharge Instructions (Addendum)
You can take the Ibuprofen 3 times a day as needed for pain.  You can also take Tylenol as  needed.    You can use heat or warm compresses for comfort.   Follow up with your OB/GYN as scheduled.

## 2021-01-20 ENCOUNTER — Encounter (INDEPENDENT_AMBULATORY_CARE_PROVIDER_SITE_OTHER): Payer: Self-pay

## 2021-01-22 ENCOUNTER — Ambulatory Visit (INDEPENDENT_AMBULATORY_CARE_PROVIDER_SITE_OTHER): Payer: 59 | Admitting: Primary Care

## 2021-01-27 ENCOUNTER — Other Ambulatory Visit (INDEPENDENT_AMBULATORY_CARE_PROVIDER_SITE_OTHER): Payer: Self-pay | Admitting: Primary Care

## 2021-01-27 DIAGNOSIS — M25572 Pain in left ankle and joints of left foot: Secondary | ICD-10-CM

## 2021-01-27 NOTE — Telephone Encounter (Signed)
Sent to PCP ?

## 2021-02-10 ENCOUNTER — Encounter (INDEPENDENT_AMBULATORY_CARE_PROVIDER_SITE_OTHER): Payer: Self-pay | Admitting: Primary Care

## 2021-02-10 ENCOUNTER — Other Ambulatory Visit: Payer: Self-pay

## 2021-02-10 ENCOUNTER — Ambulatory Visit (INDEPENDENT_AMBULATORY_CARE_PROVIDER_SITE_OTHER): Payer: 59 | Admitting: Primary Care

## 2021-02-10 ENCOUNTER — Other Ambulatory Visit (HOSPITAL_COMMUNITY)
Admission: RE | Admit: 2021-02-10 | Discharge: 2021-02-10 | Disposition: A | Discharge status: Home / Self Care | Payer: 59 | Source: Ambulatory Visit | Attending: Primary Care | Admitting: Primary Care

## 2021-02-10 VITALS — BP 121/77 | HR 80 | Temp 97.3°F | Ht 64.0 in | Wt 211.4 lb

## 2021-02-10 DIAGNOSIS — Z6836 Body mass index (BMI) 36.0-36.9, adult: Secondary | ICD-10-CM

## 2021-02-10 DIAGNOSIS — Z124 Encounter for screening for malignant neoplasm of cervix: Secondary | ICD-10-CM | POA: Diagnosis not present

## 2021-02-10 DIAGNOSIS — Z1231 Encounter for screening mammogram for malignant neoplasm of breast: Secondary | ICD-10-CM

## 2021-02-10 DIAGNOSIS — E6609 Other obesity due to excess calories: Secondary | ICD-10-CM

## 2021-02-10 DIAGNOSIS — Z113 Encounter for screening for infections with a predominantly sexual mode of transmission: Secondary | ICD-10-CM | POA: Diagnosis present

## 2021-02-10 DIAGNOSIS — N920 Excessive and frequent menstruation with regular cycle: Secondary | ICD-10-CM | POA: Diagnosis not present

## 2021-02-10 DIAGNOSIS — Z23 Encounter for immunization: Secondary | ICD-10-CM

## 2021-02-10 DIAGNOSIS — E782 Mixed hyperlipidemia: Secondary | ICD-10-CM | POA: Diagnosis not present

## 2021-02-10 NOTE — Progress Notes (Signed)
[facility]  WELL-WOMAN PAP Patient name: Sheila Zhang MRN [id number]  Date of birth: 06-Mar-1971 Chief Complaint:   Gynecologic Exam  History of Present Illness:   Sheila Zhang is a 50 y.o. No obstetric history on file. female being seen today for a routine well-woman exam.  She has concerns with anxiety that increased her heart rate > 100 and unable to sleep. Denies stressors. Does not drink caffeine coffee , or soda,or alcohol   The current method of family planning is none.  Patient's last menstrual period was 01/19/2021 (approximate).  Last pap > 2 year in [place]. Results were: normal Mother has cervical cancer  Last mammogram: > 2 year in [place].[facility] Results were: normal. Family h/o breast cancer: Yes maternal grandmother  Last colonoscopy: none cologuard was sent to the house . Family h/o colorectal cancer: No  Review of Systems:    Denies any headaches, blurred vision, fatigue, shortness of breath, chest pain, abdominal pain, abnormal vaginal discharge/itching/odor/irritation, problems with periods, bowel movements, urination, or intercourse unless otherwise stated above.  Pertinent History Reviewed:   Reviewed past medical,surgical, social and family history.  Reviewed problem list, medications and allergies.  Physical Assessment:   Vitals:   02/10/21 1341  BP: 121/77  Pulse: 80  Temp: (!) 97.3 F (36.3 C)  TempSrc: Temporal  SpO2: 96%  Weight: 211 lb 6.4 oz (95.9 kg)  Height: 5\' 4"  (1.626 m)  Body mass index is 36.29 kg/m.        Physical Examination:  General appearance - well appearing, and in no distress Mental status - alert, oriented to person, place, and time Psych:  She has a normal mood and affect Skin - warm and dry, normal color, no suspicious lesions noted Chest - effort normal, all lung fields clear to auscultation bilaterally Heart - normal rate and regular rhythm Neck:  midline trachea, no thyromegaly or nodules Breasts -  breasts appear normal, no suspicious masses, no skin or nipple changes or axillary nodes Educated patient on proper self breast examination and had patient to demonstrate SBE. Abdomen - soft, nontender, nondistended, no masses or organomegaly Pelvic-VULVA: normal appearing vulva with no masses, tenderness or lesions   VAGINA: normal appearing vagina with normal color and discharge, no lesions   CERVIX: normal appearing cervix without discharge or lesions, no CMT UTERUS: uterus is felt to be normal size, shape, consistency and nontender  ADNEXA: No adnexal masses or tenderness noted. Extremities:  No swelling or varicosities noted  No results found for this or any previous visit (from the past 24 hour(s)).   Assessment & Plan:   Orine was seen today for gynecologic exam.  Diagnoses and all orders for this visit:  1) Well-Woman Exam with cervical ancillary and  Pap  2) Breast tenderness  Mammogram   Labs/procedures today:  cervical ancillary and  Pap  Mammogram refer or sooner if problems Colonoscopy colon guard at home  or sooner if problems  Need for Tdap vaccination -     Tdap vaccine greater than or equal to 7yo IM  Cervical cancer screening -     Cervicovaginal ancillary only  Screening for STD (sexually transmitted disease) -     Cytology - PAP([place])  Breast cancer screening by mammogram -     MM Digital Screening; Future   Class 2 obesity due to excess calories without serious comorbidity with body mass index (BMI) of 36.0 to 36.9 in adult Obesity is 30-39 indicating an excess in caloric intake  or underlining conditions. This may lead to other co-morbidities. Lifestyle modifications of diet and exercise may reduce obesity.    Menorrhagia with regular cycle Menorrhalgia heavy  vaginal bleeding with menstrual cycles.   Recently seen in ED for problem  CBC  Meds: No orders of the defined types were placed in this encounter.   Follow-up: Return in  about 3 months (around 05/13/2021), or if symptoms worsen or fail to improve, for fasting .  This note has been created with Education officer, environmental. Any transcriptional errors are unintentional.   Grayce Sessions, NP 02/10/2021, 2:16 PM

## 2021-02-10 NOTE — Patient Instructions (Signed)
Influenza, Adult °Influenza is also called "the flu." It is an infection in the lungs, nose, and throat (respiratory tract). It spreads easily from person to person (is contagious). The flu causes symptoms that are like a cold, along with high fever and body aches. °What are the causes? °This condition is caused by the influenza virus. You can get the virus by: °Breathing in droplets that are in the air after a person infected with the flu coughed or sneezed. °Touching something that has the virus on it and then touching your mouth, nose, or eyes. °What increases the risk? °Certain things may make you more likely to get the flu. These include: °Not washing your hands often. °Having close contact with many people during cold and flu season. °Touching your mouth, eyes, or nose without first washing your hands. °Not getting a flu shot every year. °You may have a higher risk for the flu, and serious problems, such as a lung infection (pneumonia), if you: °Are older than 65. °Are pregnant. °Have a weakened disease-fighting system (immune system) because of a disease or because you are taking certain medicines. °Have a long-term (chronic) condition, such as: °Heart, kidney, or lung disease. °Diabetes. °Asthma. °Have a liver disorder. °Are very overweight (morbidly obese). °Have anemia. °What are the signs or symptoms? °Symptoms usually begin suddenly and last 4-14 days. They may include: °Fever and chills. °Headaches, body aches, or muscle aches. °Sore throat. °Cough. °Runny or stuffy (congested) nose. °Feeling discomfort in your chest. °Not wanting to eat as much as normal. °Feeling weak or tired. °Feeling dizzy. °Feeling sick to your stomach or throwing up. °How is this treated? °If the flu is found early, you can be treated with antiviral medicine. This can help to reduce how bad the illness is and how long it lasts. This may be given by mouth or through an IV tube. °Taking care of yourself at home can help your  symptoms get better. Your doctor may want you to: °Take over-the-counter medicines. °Drink plenty of fluids. °The flu often goes away on its own. If you have very bad symptoms or other problems, you may be treated in a hospital. °Follow these instructions at home: °  °Activity °Rest as needed. Get plenty of sleep. °Stay home from work or school as told by your doctor. °Do not leave home until you do not have a fever for 24 hours without taking medicine. °Leave home only to go to your doctor. °Eating and drinking °Take an ORS (oral rehydration solution). This is a drink that is sold at pharmacies and stores. °Drink enough fluid to keep your pee pale yellow. °Drink clear fluids in small amounts as you are able. Clear fluids include: °Water. °Ice chips. °Fruit juice mixed with water. °Low-calorie sports drinks. °Eat bland foods that are easy to digest. Eat small amounts as you are able. These foods include: °Bananas. °Applesauce. °Rice. °Lean meats. °Toast. °Crackers. °Do not eat or drink: °Fluids that have a lot of sugar or caffeine. °Alcohol. °Spicy or fatty foods. °General instructions °Take over-the-counter and prescription medicines only as told by your doctor. °Use a cool mist humidifier to add moisture to the air in your home. This can make it easier for you to breathe. °When using a cool mist humidifier, clean it daily. Empty water and replace with clean water. °Cover your mouth and nose when you cough or sneeze. °Wash your hands with soap and water often and for at least 20 seconds. This is also important after   you cough or sneeze. If you cannot use soap and water, use alcohol-based hand sanitizer. °Keep all follow-up visits. °How is this prevented? ° °Get a flu shot every year. You may get the flu shot in late summer, fall, or winter. Ask your doctor when you should get your flu shot. °Avoid contact with people who are sick during fall and winter. This is cold and flu season. °Contact a doctor if: °You get  new symptoms. °You have: °Chest pain. °Watery poop (diarrhea). °A fever. °Your cough gets worse. °You start to have more mucus. °You feel sick to your stomach. °You throw up. °Get help right away if you: °Have shortness of breath. °Have trouble breathing. °Have skin or nails that turn a bluish color. °Have very bad pain or stiffness in your neck. °Get a sudden headache. °Get sudden pain in your face or ear. °Cannot eat or drink without throwing up. °These symptoms may represent a serious problem that is an emergency. Get medical help right away. Call your local emergency services (911 in the [place] °Do not wait to see if the symptoms will go away. °Do not drive yourself to the hospital. °Summary °Influenza is also called "the flu." It is an infection in the lungs, nose, and throat. It spreads easily from person to person. °Take over-the-counter and prescription medicines only as told by your doctor. °Getting a flu shot every year is the best way to not get the flu. °This information is not intended to replace advice given to you by your health care provider. Make sure you discuss any questions you have with your health care provider. °Document Revised: 11/16/2019 Document Reviewed: 11/16/2019 °Elsevier Patient Education © 2022 [employer] Inc[employer] ° °

## 2021-02-12 LAB — CERVICOVAGINAL ANCILLARY ONLY
Bacterial Vaginitis (gardnerella): NEGATIVE
Candida Glabrata: NEGATIVE
Candida Vaginitis: NEGATIVE
Chlamydia: NEGATIVE
Comment: NEGATIVE
Comment: NEGATIVE
Comment: NEGATIVE
Comment: NEGATIVE
Comment: NEGATIVE
Comment: NORMAL
Neisseria Gonorrhea: NEGATIVE
Trichomonas: NEGATIVE

## 2021-02-13 LAB — CYTOLOGY - PAP
Comment: NEGATIVE
Diagnosis: NEGATIVE
High risk HPV: NEGATIVE

## 2021-02-16 ENCOUNTER — Ambulatory Visit (INDEPENDENT_AMBULATORY_CARE_PROVIDER_SITE_OTHER): Payer: Self-pay

## 2021-02-16 ENCOUNTER — Telehealth (INDEPENDENT_AMBULATORY_CARE_PROVIDER_SITE_OTHER): Payer: Self-pay

## 2021-02-16 NOTE — Telephone Encounter (Signed)
Patient called for triage regarding heavy menstrual bleeding.  Bleeding started 3 days ago and was very heavy with clots. Bleeding has slowed since then Pt has used 3 pads today.   Pt was seen at urgent care on 10/10 for the same problem. Pt was prescribed Ibuprofen.  Pt was also seen at the office 02/10/2021 for a well women visit.   Per protocol appointment was made within 2 weeks. Pt will call back if bleeding increases for continues more than 7 days.    Reason for Disposition  Periods with > 6 soaked pads or tampons per day  Answer Assessment - Initial Assessment Questions 1. AMOUNT: "Describe the bleeding that you are having."    - SPOTTING: spotting, or pinkish / brownish mucous discharge; does not fill panty liner or pad    - MILD:  less than 1 pad / hour; less than patient's usual menstrual bleeding   - MODERATE: 1-2 pads / hour; 1 menstrual cup every 6 hours; small-medium blood clots (e.g., pea, grape, small coin)   - SEVERE: soaking 2 or more pads/hour for 2 or more hours; 1 menstrual cup every 2 hours; bleeding not contained by pads or continuous red blood from vagina; large blood clots (e.g., golf ball, large coin)      moderate 2. ONSET: "When did the bleeding begin?" "Is it continuing now?"     3 days ago 3. MENSTRUAL PERIOD: "When was the last normal menstrual period?" "How is this different than your period?"     Heavier 4. REGULARITY: "How regular are your periods?"     Reuglar 5. ABDOMINAL PAIN: "Do you have any pain?" "How bad is the pain?"  (e.g., Scale 1-10; mild, moderate, or severe)   - MILD (1-3): doesn't interfere with normal activities, abdomen soft and not tender to touch    - MODERATE (4-7): interferes with normal activities or awakens from sleep, abdomen tender to touch    - SEVERE (8-10): excruciating pain, doubled over, unable to do any normal activities      Mild - Moderate 6. PREGNANCY: "Could you be pregnant?" "Are you sexually active?" "Did you  recently give birth?"      7. BREASTFEEDING: "Are you breastfeeding?"      8. HORMONES: "Are you taking any hormone medications, prescription or OTC?" (e.g., birth control pills, estrogen)     no 9. BLOOD THINNERS: "Do you take any blood thinners?" (e.g., Coumadin/warfarin, Pradaxa/dabigatran, aspirin)     no 10. CAUSE: "What do you think is causing the bleeding?" (e.g., recent gyn surgery, recent gyn procedure; known bleeding disorder, cervical cancer, polycystic ovarian disease, fibroids)          11. HEMODYNAMIC STATUS: "Are you weak or feeling lightheaded?" If Yes, ask: "Can you stand and walk normally?"         12. OTHER SYMPTOMS: "What other symptoms are you having with the bleeding?" (e.g., passed tissue, vaginal discharge, fever, menstrual-type cramps)       cramps  Protocols used: Vaginal Bleeding - Abnormal-A-AH

## 2021-02-16 NOTE — Telephone Encounter (Signed)
-----   Message from Grayce Sessions, NP sent at 02/14/2021  5:03 PM EDT ----- Your pap was NEGATIVE for cancerous cells and HPV (the virus that can cause genital warts). This means that you will not cervical cell sampling (pap smear) until 202025 (in 3 years). However, we recommend you return to our office every year for an annual well-woman exam; including a clinical breast exam. Therefore, we will see you in 1 year.Please call our office for any urgent problems or concerns before your annual exam is due.

## 2021-02-16 NOTE — Telephone Encounter (Signed)
Date of birth verified. Patient aware of negative pap and screenings. Maryjean Morn, CMA

## 2021-02-24 ENCOUNTER — Other Ambulatory Visit (INDEPENDENT_AMBULATORY_CARE_PROVIDER_SITE_OTHER): Payer: Self-pay | Admitting: Primary Care

## 2021-02-24 DIAGNOSIS — M25572 Pain in left ankle and joints of left foot: Secondary | ICD-10-CM

## 2021-02-24 NOTE — Telephone Encounter (Signed)
Sent to PCP ?

## 2021-03-02 ENCOUNTER — Ambulatory Visit (INDEPENDENT_AMBULATORY_CARE_PROVIDER_SITE_OTHER): Payer: 59 | Admitting: Primary Care

## 2021-03-10 ENCOUNTER — Other Ambulatory Visit: Payer: Self-pay

## 2021-03-10 ENCOUNTER — Ambulatory Visit (INDEPENDENT_AMBULATORY_CARE_PROVIDER_SITE_OTHER): Payer: 59 | Admitting: Clinical

## 2021-03-10 DIAGNOSIS — F411 Generalized anxiety disorder: Secondary | ICD-10-CM | POA: Diagnosis not present

## 2021-03-11 ENCOUNTER — Other Ambulatory Visit (INDEPENDENT_AMBULATORY_CARE_PROVIDER_SITE_OTHER): Payer: 59

## 2021-03-14 DIAGNOSIS — F411 Generalized anxiety disorder: Secondary | ICD-10-CM | POA: Insufficient documentation
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
## 2021-03-17 ENCOUNTER — Ambulatory Visit (HOSPITAL_COMMUNITY): Payer: 59 | Admitting: Physician Assistant

## 2021-03-24 ENCOUNTER — Ambulatory Visit (INDEPENDENT_AMBULATORY_CARE_PROVIDER_SITE_OTHER): Payer: 59

## 2021-03-24 ENCOUNTER — Other Ambulatory Visit: Payer: Self-pay

## 2021-03-24 ENCOUNTER — Ambulatory Visit
Admission: EM | Admit: 2021-03-24 | Discharge: 2021-03-24 | Disposition: A | Discharge status: Home / Self Care | Payer: 59 | Attending: Internal Medicine | Admitting: Internal Medicine

## 2021-03-24 DIAGNOSIS — R059 Cough, unspecified: Secondary | ICD-10-CM

## 2021-03-24 DIAGNOSIS — J069 Acute upper respiratory infection, unspecified: Secondary | ICD-10-CM | POA: Diagnosis not present

## 2021-03-24 MED ORDER — BENZONATATE 100 MG PO CAPS: 100.0000 mg | ORAL_CAPSULE | Freq: Three times a day (TID) | ORAL | 0 refills | Status: DC | PRN

## 2021-03-24 NOTE — ED Provider Notes (Signed)
[facility]    CSN: [id number] Arrival date & time: 03/24/21  6294      History   Chief Complaint Chief Complaint  Patient presents with   Cough    HPI Sheila Zhang is a 50 y.o. female.   Patient presents with productive cough and sore throat.  Patient reports that her cough has been persistent since she was diagnosed with flu around the week of Thanksgiving.  Cough is productive with yellow sputum.  Runny nose and sore throat started yesterday.  Child has similar symptoms currently.  Patient denies any known fevers.  Denies chest pain, shortness of breath, nausea, vomiting, diarrhea, abdominal pain, ear pain.  Patient has not taken any medications to help alleviate symptoms.   Cough  Past Medical History:  Diagnosis Date   Anxiety     Patient Active Problem List   Diagnosis Date Noted   Generalized anxiety disorder 03/14/2021    History reviewed. No pertinent surgical history.  OB History   No obstetric history on file.      Home Medications    Prior to Admission medications   Medication Sig Start Date End Date Taking? Authorizing Provider  benzonatate (TESSALON) 100 MG capsule Take 1 capsule (100 mg total) by mouth every 8 (eight) hours as needed for cough. 03/24/21  Yes Brittani Purdum, Rolly Salter E, FNP  atorvastatin (LIPITOR) 80 MG tablet Take 1 tablet (80 mg total) by mouth daily. 01/09/21   Grayce Sessions, NP  buPROPion (WELLBUTRIN) 100 MG tablet Take 1 tablet (100 mg total) by mouth daily. 01/05/21   Grayce Sessions, NP  ibuprofen (ADVIL) 600 MG tablet Take 1 tablet (600 mg total) by mouth every 6 (six) hours as needed. 01/19/21   Ivette Loyal, NP  tiZANidine (ZANAFLEX) 4 MG tablet TAKE 1 TABLET BY MOUTH EVERYDAY AT BEDTIME 02/24/21   Grayce Sessions, NP    Family History History reviewed. No pertinent family history.  Social History Social History   Tobacco Use   Smoking status: Never   Smokeless tobacco: Never  Substance Use  Topics   Alcohol use: Never   Drug use: Never     Allergies   Escitalopram   Review of Systems Review of Systems Per HPI  Physical Exam Triage Vital Signs ED Triage Vitals [03/24/21 0856]  Enc Vitals Group     BP 121/82     Pulse Rate 89     Resp 18     Temp 98 F (36.7 C)     Temp Source Oral     SpO2 96 %     Weight      Height      Head Circumference      Peak Flow      Pain Score 5     Pain Loc      Pain Edu?      Excl. in GC?    No data found.  Updated Vital Signs BP 121/82 (BP Location: Left Arm)    Pulse 89    Temp 98 F (36.7 C) (Oral)    Resp 18    LMP 03/11/2021    SpO2 96%   Visual Acuity Right Eye Distance:   Left Eye Distance:   Bilateral Distance:    Right Eye Near:   Left Eye Near:    Bilateral Near:     Physical Exam Constitutional:      General: She is not in acute distress.    Appearance: Normal appearance.  She is not toxic-appearing or diaphoretic.  HENT:     Head: Normocephalic and atraumatic.     Right Ear: Tympanic membrane and ear canal normal.     Left Ear: Tympanic membrane and ear canal normal.     Nose: Congestion present.     Mouth/Throat:     Mouth: Mucous membranes are moist.     Pharynx: No posterior oropharyngeal erythema.  Eyes:     Extraocular Movements: Extraocular movements intact.     Conjunctiva/sclera: Conjunctivae normal.     Pupils: Pupils are equal, round, and reactive to light.  Cardiovascular:     Rate and Rhythm: Normal rate and regular rhythm.     Pulses: Normal pulses.     Heart sounds: Normal heart sounds.  Pulmonary:     Effort: Pulmonary effort is normal. No respiratory distress.     Breath sounds: Normal breath sounds. No stridor. No wheezing, rhonchi or rales.  Abdominal:     General: Abdomen is flat. Bowel sounds are normal.     Palpations: Abdomen is soft.  Musculoskeletal:        General: Normal range of motion.     Cervical back: Normal range of motion.  Skin:    General: Skin is  warm and dry.  Neurological:     General: No focal deficit present.     Mental Status: She is alert and oriented to person, place, and time. Mental status is at baseline.  Psychiatric:        Mood and Affect: Mood normal.        Behavior: Behavior normal.     UC Treatments / Results  Labs (all labs ordered are listed, but only abnormal results are displayed) Labs Reviewed  COVID-19, FLU A+B NAA    EKG   Radiology DG Chest 2 View  Result Date: 03/24/2021 CLINICAL DATA:  Persistent cough EXAM: CHEST - 2 VIEW COMPARISON:  None. FINDINGS: Cardiomediastinal silhouette is within normal limits. There is no focal airspace consolidation. There is no large pleural effusion. There is no visible pneumothorax. There is no acute osseous abnormality. IMPRESSION: No evidence of acute cardiopulmonary disease. Electronically Signed   By: Caprice Renshaw M.D.   On: 03/24/2021 09:44    Procedures Procedures (including critical care time)  Medications Ordered in UC Medications - No data to display  Initial Impression / Assessment and Plan / UC Course  I have reviewed the triage vital signs and the nursing notes.  Pertinent labs & imaging results that were available during my care of the patient were reviewed by me and considered in my medical decision making (see chart for details).     Patient presents with symptoms likely from a viral upper respiratory infection. Differential includes bacterial pneumonia, sinusitis, allergic rhinitis, covid 19, flu. Do not suspect underlying cardiopulmonary process. Symptoms seem unlikely related to ACS, CHF or COPD exacerbations, pneumonia, pneumothorax. Patient is nontoxic appearing and not in need of emergent medical intervention.  COVID-19 and flu test pending.  Recommended symptom control with over the counter medications.  Benzonatate to take as needed for cough.  Patient offered prednisone due to persistent cough but declined.  Return if symptoms fail to  improve in 1-2 weeks or you develop shortness of breath, chest pain, severe headache. Patient states understanding and is agreeable.  Discharged with PCP followup.  Final Clinical Impressions(s) / UC Diagnoses   Final diagnoses:  Cough, unspecified type  Viral upper respiratory infection     Discharge Instructions  Your chest x-ray was normal.  You have been prescribed a cough medication to take as needed.  Your COVID-19 and flu test is pending.  We will call you if it is positive.     ED Prescriptions     Medication Sig Dispense Auth. Provider   benzonatate (TESSALON) 100 MG capsule Take 1 capsule (100 mg total) by mouth every 8 (eight) hours as needed for cough. 21 capsule [place], Acie Fredrickson, [place]      PDMP not reviewed this encounter.   Gustavus Bryant, [place] 03/24/21 [phone]

## 2021-03-24 NOTE — ED Triage Notes (Signed)
Pt c/o cough and sore throat since yesterday. States had the flu a few weeks ago.

## 2021-03-24 NOTE — Discharge Instructions (Signed)
Your chest x-ray was normal.  You have been prescribed a cough medication to take as needed.  Your COVID-19 and flu test is pending.  We will call you if it is positive.

## 2021-03-25 LAB — COVID-19, FLU A+B NAA
Influenza A, NAA: NOT DETECTED
Influenza B, NAA: NOT DETECTED
SARS-CoV-2, NAA: DETECTED — AB

## 2021-03-26 ENCOUNTER — Other Ambulatory Visit (INDEPENDENT_AMBULATORY_CARE_PROVIDER_SITE_OTHER): Payer: Self-pay | Admitting: Primary Care

## 2021-03-26 DIAGNOSIS — M25572 Pain in left ankle and joints of left foot: Secondary | ICD-10-CM

## 2021-03-26 NOTE — Telephone Encounter (Signed)
Sent to PCP ?

## 2021-04-01 ENCOUNTER — Ambulatory Visit: Payer: 59

## 2021-04-09 ENCOUNTER — Other Ambulatory Visit (INDEPENDENT_AMBULATORY_CARE_PROVIDER_SITE_OTHER): Payer: Self-pay | Admitting: Primary Care

## 2021-04-09 DIAGNOSIS — M25572 Pain in left ankle and joints of left foot: Secondary | ICD-10-CM

## 2021-04-10 NOTE — Telephone Encounter (Signed)
° °  Notes to clinic:  pharmacy requesting change in rx, No Show for 03/11/21 appt, no upcoming appt scheduled.   Requested Prescriptions  Pending Prescriptions Disp Refills   tiZANidine (ZANAFLEX) 4 MG tablet [Pharmacy Med Name: TIZANIDINE HCL 4 MG TABLET] 90 tablet 1    Sig: TAKE 1 TABLET BY MOUTH EVERYDAY AT BEDTIME     Not Delegated - Cardiovascular:  Alpha-2 Agonists - tizanidine Failed - 04/09/2021  7:51 PM      Failed - This refill cannot be delegated      Passed - Valid encounter within last 6 months    Recent Outpatient Visits           1 month ago Need for Tdap vaccination   [facility] Grayce Sessions, NP   3 months ago Hospital discharge follow-up   [facility] Grayce Sessions, NP

## 2021-04-10 NOTE — Telephone Encounter (Signed)
Requested medication (s) are due for refill today:   Provider to review  Requested medication (s) are on the active medication list:   Yes  Future visit scheduled:   No   Last ordered: 03/27/2021 #30, 0 refills  Non delegated refill      A 90 day supply is being requested   Requested Prescriptions  Pending Prescriptions Disp Refills   tiZANidine (ZANAFLEX) 4 MG tablet [Pharmacy Med Name: TIZANIDINE HCL 4 MG TABLET] 90 tablet 1    Sig: TAKE 1 TABLET BY MOUTH EVERYDAY AT BEDTIME     Not Delegated - Cardiovascular:  Alpha-2 Agonists - tizanidine Failed - 04/09/2021  7:51 PM      Failed - This refill cannot be delegated      Passed - Valid encounter within last 6 months    Recent Outpatient Visits           1 month ago Need for Tdap vaccination   [facility] Grayce Sessions, NP   3 months ago Hospital discharge follow-up   [facility] Grayce Sessions, NP

## 2021-04-27 ENCOUNTER — Other Ambulatory Visit: Payer: Self-pay

## 2021-04-27 ENCOUNTER — Ambulatory Visit (INDEPENDENT_AMBULATORY_CARE_PROVIDER_SITE_OTHER): Payer: 59

## 2021-04-27 ENCOUNTER — Ambulatory Visit
Admission: EM | Admit: 2021-04-27 | Discharge: 2021-04-27 | Disposition: A | Discharge status: Home / Self Care | Payer: 59 | Attending: Physician Assistant | Admitting: Physician Assistant

## 2021-04-27 ENCOUNTER — Encounter: Payer: Self-pay | Admitting: Emergency Medicine

## 2021-04-27 DIAGNOSIS — M25522 Pain in left elbow: Secondary | ICD-10-CM

## 2021-04-27 MED ORDER — PREDNISONE 20 MG PO TABS: 40.0000 mg | ORAL_TABLET | Freq: Every day | ORAL | 0 refills | Status: AC

## 2021-04-27 NOTE — ED Provider Notes (Signed)
[facility]    CSN: [id number] Arrival date & time: 04/27/21  0923      History   Chief Complaint Chief Complaint  Patient presents with   Elbow Pain    HPI Sheila Zhang is a 51 y.o. female.   Patient here today for evaluation of left elbow pain and left arm pain that started gradually after she felt a pop in her left elbow on Tuesday when she was lifting a heavy item at work.  She states initially she did not have significant pain but since the accident she has had gradual pain worsen.  She does still have full range of motion of her shoulder, elbow, wrist and fingers.  She reports she does have some tingling to her left fingers that seems to be more constant.  She has tried taking a muscle relaxer without significant relief.  The history is provided by the patient.   Past Medical History:  Diagnosis Date   Anxiety     Patient Active Problem List   Diagnosis Date Noted   Generalized anxiety disorder 03/14/2021    History reviewed. No pertinent surgical history.  OB History   No obstetric history on file.      Home Medications    Prior to Admission medications   Medication Sig Start Date End Date Taking? Authorizing Provider  predniSONE (DELTASONE) 20 MG tablet Take 2 tablets (40 mg total) by mouth daily with breakfast for 5 days. 04/27/21 05/02/21 Yes Tomi Bamberger, PA-C  atorvastatin (LIPITOR) 80 MG tablet Take 1 tablet (80 mg total) by mouth daily. 01/09/21   Grayce Sessions, NP  benzonatate (TESSALON) 100 MG capsule Take 1 capsule (100 mg total) by mouth every 8 (eight) hours as needed for cough. 03/24/21   Gustavus Bryant, FNP  buPROPion (WELLBUTRIN) 100 MG tablet Take 1 tablet (100 mg total) by mouth daily. 01/05/21   Grayce Sessions, NP  ibuprofen (ADVIL) 600 MG tablet Take 1 tablet (600 mg total) by mouth every 6 (six) hours as needed. 01/19/21   Ivette Loyal, NP  tiZANidine (ZANAFLEX) 4 MG tablet TAKE 1 TABLET BY MOUTH EVERYDAY  AT BEDTIME 03/27/21   Grayce Sessions, NP    Family History History reviewed. No pertinent family history.  Social History Social History   Tobacco Use   Smoking status: Never   Smokeless tobacco: Never  Substance Use Topics   Alcohol use: Never   Drug use: Never     Allergies   Escitalopram   Review of Systems Review of Systems  Constitutional:  Negative for chills and fever.  Eyes:  Negative for discharge and redness.  Respiratory:  Negative for shortness of breath.   Gastrointestinal:  Negative for abdominal pain, nausea and vomiting.  Musculoskeletal:  Positive for arthralgias and myalgias. Negative for joint swelling.  Neurological:  Positive for numbness.    Physical Exam Triage Vital Signs ED Triage Vitals  Enc Vitals Group     BP 04/27/21 0920 136/78     Pulse Rate 04/27/21 0920 86     Resp 04/27/21 0920 16     Temp 04/27/21 0920 98.1 F (36.7 C)     Temp Source 04/27/21 0920 Oral     SpO2 04/27/21 0920 96 %     Weight --      Height --      Head Circumference --      Peak Flow --      Pain Score 04/27/21 0926  4     Pain Loc --      Pain Edu? --      Excl. in GC? --    No data found.  Updated Vital Signs BP 136/78 (BP Location: Right Arm)    Pulse 86    Temp 98.1 F (36.7 C) (Oral)    Resp 16    SpO2 96%      Physical Exam Vitals and nursing note reviewed.  Constitutional:      General: She is not in acute distress.    Appearance: Normal appearance. She is not ill-appearing.  HENT:     Head: Normocephalic and atraumatic.  Eyes:     Conjunctiva/sclera: Conjunctivae normal.  Cardiovascular:     Rate and Rhythm: Normal rate.  Pulmonary:     Effort: Pulmonary effort is normal.  Musculoskeletal:     Comments: Full range of motion of left shoulder, elbow, wrist and fingers.  No apparent swelling or erythema to left elbow  Neurological:     Mental Status: She is alert.     Comments: Grip strength 5/5 bilaterally  Psychiatric:         Mood and Affect: Mood normal.        Behavior: Behavior normal.        Thought Content: Thought content normal.     UC Treatments / Results  Labs (all labs ordered are listed, but only abnormal results are displayed) Labs Reviewed - No data to display  EKG   Radiology DG Elbow Complete Left  Result Date: 04/27/2021 CLINICAL DATA:  Twisting injury.  Progressive pain. EXAM: LEFT ELBOW - COMPLETE 3+ VIEW COMPARISON:  None. FINDINGS: No acute fracture or dislocation.  No joint effusion. IMPRESSION: No acute osseous abnormality. Electronically Signed   By: Jeronimo Greaves M.D.   On: 04/27/2021 09:45    Procedures Procedures (including critical care time)  Medications Ordered in UC Medications - No data to display  Initial Impression / Assessment and Plan / UC Course  I have reviewed the triage vital signs and the nursing notes.  Pertinent labs & imaging results that were available during my care of the patient were reviewed by me and considered in my medical decision making (see chart for details).   Xray without concerning findings. Will treat with steroid to decrease any inflammation but recommended follow up with ortho if no improvement over the next week.   Final Clinical Impressions(s) / UC Diagnoses   Final diagnoses:  Left elbow pain   Discharge Instructions   None    ED Prescriptions     Medication Sig Dispense Auth. Provider   predniSONE (DELTASONE) 20 MG tablet Take 2 tablets (40 mg total) by mouth daily with breakfast for 5 days. 10 tablet Tomi Bamberger, PA-C      PDMP not reviewed this encounter.   Tomi Bamberger, PA-C 04/27/21 1111

## 2021-04-27 NOTE — ED Triage Notes (Addendum)
Left elbow popped last Tuesday after lifting heavy items at work. Was not initially in pain, pain increased gradually since then. Has retained full active ROM in shoulder, elbow, wrist, and fingers of left arm. Pain located on the lateral side of her elbow, exacerbated whenever she has to carry something. Reports mild tingling sensation in fingertips since injury. Radial/ulnar pulses palpable, fingers warm. Has been taking tizanidine to treat at night she had left over from a previous ankle problem without improvement.

## 2021-05-04 ENCOUNTER — Encounter (HOSPITAL_COMMUNITY): Payer: Self-pay

## 2021-05-04 ENCOUNTER — Ambulatory Visit (HOSPITAL_COMMUNITY): Payer: 59 | Admitting: Clinical

## 2021-05-05 ENCOUNTER — Ambulatory Visit (HOSPITAL_COMMUNITY): Payer: 59 | Admitting: Clinical

## 2021-05-12 ENCOUNTER — Ambulatory Visit (HOSPITAL_COMMUNITY): Payer: 59 | Admitting: Physician Assistant

## 2021-05-12 ENCOUNTER — Encounter: Payer: Self-pay | Admitting: Orthopedic Surgery

## 2021-05-12 ENCOUNTER — Other Ambulatory Visit: Payer: Self-pay

## 2021-05-12 ENCOUNTER — Ambulatory Visit (INDEPENDENT_AMBULATORY_CARE_PROVIDER_SITE_OTHER): Payer: 59 | Admitting: Orthopedic Surgery

## 2021-05-12 DIAGNOSIS — M7712 Lateral epicondylitis, left elbow: Secondary | ICD-10-CM

## 2021-05-12 MED ORDER — MELOXICAM 7.5 MG PO TABS: 7.5000 mg | ORAL_TABLET | Freq: Every day | ORAL | 0 refills | Status: DC

## 2021-05-12 NOTE — Progress Notes (Signed)
Office Visit Note   Patient: Sheila Zhang           Date of Birth: 16-Jul-1970           MRN: [id number] Visit Date: 05/12/2021              Requested by: Kerin Perna, NP [address] [place],  [place] [address] PCP: Kerin Perna, NP   Assessment & Plan: Visit Diagnoses:  1. Lateral epicondylitis, left elbow     Plan: Patient's history and physical exam seem most consistent with new onset lateral epicondylitis of her elbow.  X-rays taken in the urgent care are reviewed today.  They do not demonstrate any acute bony injury.  There is some evidence of degenerative changes at the ulnohumeral joint but no loose bodies.  Most of her tenderness seems to be directly over the lateral epicondyle and just distal.  She has pain with resisted middle finger extension with the elbow extended.  She denies any mechanical symptoms or subjective instability and has no instability on exam.  Her biceps and triceps tendons are intact with 5-5 strength.   We discussed conservative management including oral anti-inflammatory medication and activity modification at work including no lifting, pushing, pulling, or twisting motions of the left arm.  I can see her back in 6 weeks to see if she is having symptom improvement.  Follow-Up Instructions: No follow-ups on file.   Orders:  No orders of the defined types were placed in this encounter.  No orders of the defined types were placed in this encounter.     Procedures: No procedures performed   Clinical Data: No additional findings.   Subjective: Chief Complaint  Patient presents with   Left Elbow - Pain    RIGHT Handed, had it 45 yrs ago in [place], did PT pain went away, then she lifted something at work and had a pop in elbow, states that she has tingling in finger tips, constant pain, Pain 4-5/10, was seen at [facility]    This is a 51 year old right-hand-dominant female who works at [employer] and presents with left elbow pain  after lifting something heavy at work approximately 2 weeks ago.  She thinks she felt a pop in the lateral aspect of her elbow.  Since then she had pain is centered over the elbow mostly the lateral side.  Her pain is worse with lifting or rotational activities.  She was given a oral steroid for 5 days in the ER which improved her shoulder pain, swelling, and hand numbness but she still has elbow pain.  She denies any instability or mechanical symptoms.  She is a history of lateral epicondylitis that was treated some years ago with physical therapy in [place].     Review of Systems   Objective: Vital Signs: BP 105/72 (BP Location: Left Arm, Patient Position: Sitting)    Pulse 81    Ht 5\' 4"  (1.626 m)    Wt 212 lb (96.2 kg)    BMI 36.39 kg/m   Physical Exam Constitutional:      Appearance: Normal appearance.  Cardiovascular:     Rate and Rhythm: Normal rate.     Pulses: Normal pulses.  Pulmonary:     Effort: Pulmonary effort is normal.  Skin:    General: Skin is warm.     Capillary Refill: Capillary refill takes less than 2 seconds.  Neurological:     Mental Status: She is alert.    Left Elbow  Exam   Tenderness  The patient is experiencing tenderness in the lateral epicondyle.   Range of Motion  The patient has normal left elbow ROM.  Muscle Strength  The patient has normal left elbow strength.  Tests  Varus: negative Valgus: negative Tinel's sign (cubital tunnel): negative  Other  Sensation: normal Pulse: present  Comments:  TTP directly over the lateral epicondyle.  Pain seems to be reproduced with resisted middle finger extension with elbow extended.  Intact biceps tendon with hook test and 5/5 EF and supination with elbow flexed. 5/5 triceps extension.  No TTP at medial epicondyle.  Full ROM without crepitus or pain.      Specialty Comments:  No specialty comments available.  Imaging: No results found.   PMFS History: Patient Active Problem List    Diagnosis Date Noted   Lateral epicondylitis, left elbow 05/12/2021   Generalized anxiety disorder 03/14/2021   Past Medical History:  Diagnosis Date   Anxiety     No family history on file.  No past surgical history on file. Social History   Occupational History   Not on file  Tobacco Use   Smoking status: Never   Smokeless tobacco: Never  Substance and Sexual Activity   Alcohol use: Never   Drug use: Never   Sexual activity: Not on file

## 2021-05-14 ENCOUNTER — Encounter (HOSPITAL_COMMUNITY): Payer: Self-pay | Admitting: Psychiatry

## 2021-05-14 ENCOUNTER — Other Ambulatory Visit: Payer: Self-pay

## 2021-05-14 ENCOUNTER — Ambulatory Visit (INDEPENDENT_AMBULATORY_CARE_PROVIDER_SITE_OTHER): Payer: 59 | Admitting: Psychiatry

## 2021-05-14 VITALS — BP 126/60 | HR 76 | Temp 98.2°F | Ht 64.0 in | Wt 212.0 lb

## 2021-05-14 DIAGNOSIS — F411 Generalized anxiety disorder: Secondary | ICD-10-CM

## 2021-05-14 MED ORDER — VENLAFAXINE HCL ER 37.5 MG PO CP24: 37.5000 mg | ORAL_CAPSULE | Freq: Every day | ORAL | 1 refills | Status: DC

## 2021-05-14 NOTE — Progress Notes (Signed)
Psychiatric Initial Adult Assessment   Patient Identification: Sheila Zhang MRN:  [id number] Date of Evaluation:  05/15/2021 Referral Source: Gwinda PasseMichelle Edwards NP Chief Complaint:   Chief Complaint   Medication Management    Visit Diagnosis:    ICD-10-CM   1. Generalized anxiety disorder  F41.1       History of Present Illness:  Sheila Zhang is a 51 year old female [facility] medication management. Patient reports being diagnosed with anxiety in 2014. Her symptoms are managed with Wellbutrin and she states that her medication regimen is ineffective. Patient reports that she previously was prescribed escitalopram and citalopram, but she had an adverse medication reaction of tongue swelling.   Associated Signs/Symptoms: Depression Symptoms:  difficulty concentrating, anxiety, loss of energy/fatigue, disturbed sleep, (Hypo) Manic Symptoms:  Distractibility, Anxiety Symptoms:  Social Anxiety, Psychotic Symptoms:   none PTSD Symptoms: Negative  Past Psychiatric History: Anxiety  Previous Psychotropic Medications: Yes    Substance Abuse History in the last 12 months:  No.  Consequences of Substance Abuse: Negative  Past Medical History:  Past Medical History:  Diagnosis Date   Anxiety    No past surgical history on file.  Family Psychiatric History: Mother: anxiety, suicidal ideations, depression. Sister: anxiety.  Family History: No family history on file.  Social History:   Social History   Socioeconomic History   Marital status: Single    Spouse name: Not on file   Number of children: Not on file   Years of education: Not on file   Highest education level: Not on file  Occupational History   Not on file  Tobacco Use   Smoking status: Never   Smokeless tobacco: Never  Substance and Sexual Activity   Alcohol use: Never   Drug use: Never   Sexual activity: Not on file  Other Topics Concern    Not on file  Social History Narrative   Not on file   Social Determinants of Health   Financial Resource Strain: Not on file  Food Insecurity: Not on file  Transportation Needs: Not on file  Physical Activity: Not on file  Stress: Not on file  Social Connections: Not on file    Additional Social History: 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 Patient reports living with her boyfriend and her two sons. Patient stated that she has two other adult children (female and female) who live in [place]. Patient states that she has friends for social support. She denies alcohol, tobacco, or illicit drug use. Patient reports she shops and watches movies as hobbies.   Allergies:   Allergies  Allergen Reactions   Escitalopram     Burning sensation     Metabolic Disorder Labs: Lab Results  Component Value Date   HGBA1C 5.7 01/05/2021   No results found for: PROLACTIN Lab Results  Component Value Date   CHOL 241 (H) 01/05/2021   TRIG 264 (H) 01/05/2021   HDL 57 01/05/2021   CHOLHDL 4.2 01/05/2021   LDLCALC 137 (H) 01/05/2021   Lab Results  Component Value Date   TSH 1.030 01/05/2021    Therapeutic Level Labs: No results found for: LITHIUM No results found for: CBMZ No results found for: VALPROATE  Current Medications: Current Outpatient Medications  Medication Sig Dispense Refill   venlafaxine XR (EFFEXOR XR) 37.5 MG 24 hr capsule Take 1 capsule (37.5 mg total) by mouth daily. 30 capsule 1   atorvastatin (  LIPITOR) 80 MG tablet Take 1 tablet (80 mg total) by mouth daily. 90 tablet 3   benzonatate (TESSALON) 100 MG capsule Take 1 capsule (100 mg total) by mouth every 8 (eight) hours as needed for cough. 21 capsule 0   ibuprofen (ADVIL) 600 MG tablet Take 1 tablet (600 mg total) by mouth every 6 (six) hours as needed. 30 tablet 0   meloxicam (MOBIC) 7.5 MG tablet Take 1 tablet (7.5 mg total) by mouth daily. 30 tablet 0   tiZANidine  (ZANAFLEX) 4 MG tablet TAKE 1 TABLET BY MOUTH EVERYDAY AT BEDTIME 30 tablet 0   No current facility-administered medications for this visit.    Musculoskeletal: Strength & Muscle Tone: within normal limits Gait & Station: normal Patient leans: N/A  Psychiatric Specialty Exam: Review of Systems  Psychiatric/Behavioral:  Negative for agitation, behavioral problems, hallucinations and suicidal ideas. The patient is nervous/anxious.   All other systems reviewed and are negative.  Blood pressure 126/60, pulse 76, temperature 98.2 F (36.8 C), height 5\' 4"  (1.626 m), weight 212 lb (96.2 kg), SpO2 100 %.Body mass index is 36.39 kg/m.  General Appearance: Well Groomed  Eye Contact:  Good  Speech:  Clear and Coherent  Volume:  Normal  Mood:  Anxious  Affect:  Appropriate  Thought Process:  Coherent  Orientation:  Full (Time, Place, and Person)  Thought Content:  Logical  Suicidal Thoughts:  No  Homicidal Thoughts:  No  Memory:  Immediate;   Good Recent;   Good Remote;   Good  Judgement:  Good  Insight:  Good  Psychomotor Activity:  Normal  Concentration:  Concentration: Good and Attention Span: Good  Recall:  Good  Fund of Knowledge:Good  Language: Good  Akathisia:  Negative  Handed:  Right  AIMS (if indicated):  not done  Assets:  Housing  ADL's:  Intact  Cognition: WNL  Sleep:  Good   Screenings: GAD-7    Flowsheet Row Office Visit from 05/14/2021 in [facility] from 03/10/2021 in [facility] Office Visit from 02/10/2021 in [facility] Office Visit from 01/05/2021 in [facility]  Total GAD-7 Score 14 15 11 12       PHQ2-9    Flowsheet Row Office Visit from 05/14/2021 in [facility] Office Visit from 02/10/2021 in [facility] Office Visit from 01/05/2021 in [facility]  [facility]  PHQ-2 Total Score 2 2 2   PHQ-9 Total Score 9 6 10       Flowsheet Row Office Visit from 05/14/2021 in [facility] ED from 04/27/2021 in [facility]  ED from 03/24/2021 in [facility] at [facility]   C-SSRS RISK CATEGORY No Risk No Risk No Risk       Assessment and Plan: Sheila Zhang presents to [facility] with reports of an ineffective medication regimen to manage symptoms of anxiety. She reports hypersensitivity to medications and is reluctant to initiate alternative treatment options. Patient is in agreement with initiating a low dose of Effexor following medication teaching. Patient acknowledged understanding of medication teaching. Wellbutrin discontinued during this visit. Effexor 37.5 mg e-scribed to patient's preferred pharmacy. Patient recommended to return to care in six weeks or as needed if symptoms worsen.  Diagnosis/Treatment:    ICD-10-CM   1. Generalized anxiety disorder  F41.1  Medications: Effexor XR 37.5 mg by mouth daily  Meds ordered this encounter  Medications   venlafaxine XR (EFFEXOR XR) 37.5 MG 24 hr capsule    Sig: Take 1 capsule (37.5 mg total) by mouth daily.    Dispense:  30 capsule    Refill:  1    Discontinue Bupropion    Order Specific Question:   Supervising Provider    Answer:   Nelly Rout [[id number]]      Mcneil Sober, NP 2/3/202312:25 AM

## 2021-06-08 ENCOUNTER — Other Ambulatory Visit: Payer: Self-pay | Admitting: Orthopedic Surgery

## 2021-06-11 ENCOUNTER — Encounter (HOSPITAL_COMMUNITY): Payer: Self-pay

## 2021-06-11 ENCOUNTER — Telehealth (HOSPITAL_COMMUNITY): Payer: 59 | Admitting: Psychiatry

## 2021-06-23 ENCOUNTER — Ambulatory Visit: Payer: 59 | Admitting: Orthopedic Surgery

## 2021-06-25 ENCOUNTER — Ambulatory Visit (INDEPENDENT_AMBULATORY_CARE_PROVIDER_SITE_OTHER): Payer: 59 | Admitting: Primary Care

## 2021-06-25 ENCOUNTER — Encounter (INDEPENDENT_AMBULATORY_CARE_PROVIDER_SITE_OTHER): Payer: Self-pay

## 2021-06-26 ENCOUNTER — Ambulatory Visit: Payer: 59 | Admitting: Orthopedic Surgery

## 2021-06-30 ENCOUNTER — Ambulatory Visit (HOSPITAL_COMMUNITY): Payer: 59 | Admitting: Physician Assistant

## 2021-06-30 ENCOUNTER — Telehealth (HOSPITAL_COMMUNITY): Payer: 59 | Admitting: Psychiatry

## 2021-06-30 ENCOUNTER — Encounter (HOSPITAL_COMMUNITY): Payer: Self-pay

## 2021-07-06 ENCOUNTER — Ambulatory Visit (INDEPENDENT_AMBULATORY_CARE_PROVIDER_SITE_OTHER): Payer: 59 | Admitting: Clinical

## 2021-07-06 DIAGNOSIS — F411 Generalized anxiety disorder: Secondary | ICD-10-CM

## 2021-07-06 NOTE — Plan of Care (Signed)
Client was in agreement to the plan. ?

## 2021-07-06 NOTE — Progress Notes (Signed)
?THERAPIST PROGRESS NOTE ?Virtual Visit via Video Note ? ?I connected with Sheila Zhang on 07/06/21 at 10:00 AM EDT by a video enabled telemedicine application and verified that I am speaking with the correct person using two identifiers. ? ?Location: ?Patient: home ?Provider: office ?  ?I discussed the limitations of evaluation and management by telemedicine and the availability of in person appointments. The patient expressed understanding and agreed to proceed. ? ? ?Follow Up Instructions: ?I discussed the assessment and treatment plan with the patient. The patient was provided an opportunity to ask questions and all were answered. The patient agreed with the plan and demonstrated an understanding of the instructions. ?  ?The patient was advised to call back or seek an in-person evaluation if the symptoms worsen or if the condition fails to improve as anticipated. ? ? ? ?Session Time: 16 minutes ? ?Participation Level: Active ? ?Behavioral Response: CasualAlertEuthymic ? ?Type of Therapy: Individual Therapy ? ?Treatment Goals addressed: client will score less than a 5 on the GAD-7  ? ?ProgressTowards Goals: Progressing ? ?Interventions: CBT and Supportive ? ?Summary:  ?Sheila Zhang is a 51 y.o. female who presents for the scheduled session oriented times five, appropriately dressed, and friendly. Client denied hallucinations and delusions. ?Client reported on today she is doing well. Client reported her visits with the psychiatrist have been going and her current medication regimen is working well for her anxiety symptoms. Client reported she stakes her medication at night which is better. Client reported she is doing well at her job with no complaints. Client reported she is eating, sleeping, and doing self grooming and hygiene activities well. ? ?  07/06/2021  ? 10:19 AM 05/14/2021  ? 11:27 AM 03/10/2021  ?  9:22 AM 02/10/2021  ?  1:42 PM  ?GAD 7 : Generalized Anxiety Score  ?Nervous, Anxious, on  Edge 0 1 1 2   ?Control/stop worrying 0 3 3 2   ?Worry too much - different things 0 3 3 2   ?Trouble relaxing 0 2 2 1   ?Restless 0 3 2 1   ?Easily annoyed or irritable 0 1 3 1   ?Afraid - awful might happen 0 1 1 2   ?Total GAD 7 Score 0 14 15 11   ?Anxiety Difficulty Not difficult at all Not difficult at all Somewhat difficult Very difficult  ? ? ? ?Suicidal/Homicidal: Nowithout intent/plan ? ?Therapist Response:  ?Therapist began the appointment asking the client how she has been doing since last seen. ?Therapist used CBT to engage using active listening and positive emotional support. ?Therapist used CBT to engage and ask the client about her the severity of her symptoms compared to medication compliance/ ?Therapist completed GAD-7 with the client. ?Therapist used CBT ask the client to identify her progress with frequency of use with coping skills with continued practice in her daily activity.    ?Client was assigned homework to practice self care. ?Client was scheduled for next appointment. ? ? ? ?Plan: Return again in 5 weeks. ? ?Diagnosis: generalized anxiety disorder ? ?Collaboration of Care: Patient refused AEB none requested by the client at this time. ? ?Patient/Guardian was advised Release of Information must be obtained prior to any record release in order to collaborate their care with an outside provider. Patient/Guardian was advised if they have not already done so to contact the registration department to sign all necessary forms in order for to release information regarding their care.  ? ?Consent: Patient/Guardian gives verbal consent for treatment and assignment of benefits for services  provided during this visit. Patient/Guardian expressed understanding and agreed to proceed.  ? ?Sheila Rhymes Savaya Hakes, LCSW ?07/06/2021 ? ?

## 2021-07-07 ENCOUNTER — Telehealth (INDEPENDENT_AMBULATORY_CARE_PROVIDER_SITE_OTHER): Payer: 59 | Admitting: Psychiatry

## 2021-07-07 ENCOUNTER — Telehealth (HOSPITAL_COMMUNITY): Payer: Self-pay | Admitting: *Deleted

## 2021-07-07 DIAGNOSIS — F411 Generalized anxiety disorder: Secondary | ICD-10-CM

## 2021-07-07 MED ORDER — VENLAFAXINE HCL ER 37.5 MG PO CP24: 37.5000 mg | ORAL_CAPSULE | Freq: Every day | ORAL | 2 refills | Status: DC

## 2021-07-07 NOTE — Telephone Encounter (Signed)
Pharmacy faxed a request for her to have a 90 day supply of her venalfaxine. She has refills available and is not out, sent back to pharmacy a response stating a 90 day supply can be discussed at her next appt with her provider which is on 09/22/21. ?

## 2021-07-07 NOTE — Progress Notes (Signed)
BH MD/PA/NP OP Progress Note ? ?07/07/2021 8:40 AM ?Coralyn Mark  ?MRN:  [id number] ? ?Virtual Visit via Video Note ? ?I connected with Sheila Zhang on 07/07/21 at  8:30 AM EDT by a video enabled telemedicine application and verified that I am speaking with the correct person using two identifiers. ? ?Location: ?Patient: home ?Provider: offsite ?  ?I discussed the limitations of evaluation and management by telemedicine and the availability of in person appointments. The patient expressed understanding and agreed to proceed. ?  ?I discussed the assessment and treatment plan with the patient. The patient was provided an opportunity to ask questions and all were answered. The patient agreed with the plan and demonstrated an understanding of the instructions. ?  ?The patient was advised to call back or seek an in-person evaluation if the symptoms worsen or if the condition fails to improve as anticipated. ? ?I provided 5 minutes of non-face-to-face time during this encounter. ? ? ?Mcneil Sober, NP  ? ?Chief Complaint: Medication management ? ?HPI: Sheila Zhang is a 51 year old female presenting to [facility] behavioral health outpatient for follow-up psychiatric evaluation.  She has a psychiatric history of generalized anxiety disorder and her symptoms are managed with Effexor Exar 37.5 mg daily.  Patient reports medication compliance and states that medications are effective with managing symptoms.  Patient denies adverse effects and denies the need for dose adjustment today.  No medication changes. ? ?Patient is alert and oriented x4, calm, pleasant and willing to engage.  She appears well groomed and dressed appropriately for the weather.  She reports a good mood, sleep and appetite.  Patient denies suicidal or homicidal ideations, paranoia, delusional thought, auditory or visual hallucinations. ? ? ?Visit Diagnosis:  ?  ICD-10-CM   ?1. Generalized anxiety disorder  F41.1   ?  ? ? ?Past  Psychiatric History:  ? ? ?Past Medical History:  ?Past Medical History:  ?Diagnosis Date  ? Anxiety   ? No past surgical history on file. ? ?Family Psychiatric History: None known ? ?Family History: No family history on file. ? ?Social History:  ?Social History  ? ?Socioeconomic History  ? Marital status: Single  ?  Spouse name: Not on file  ? Number of children: Not on file  ? Years of education: Not on file  ? Highest education level: Not on file  ?Occupational History  ? Not on file  ?Tobacco Use  ? Smoking status: Never  ? Smokeless tobacco: Never  ?Substance and Sexual Activity  ? Alcohol use: Never  ? Drug use: Never  ? Sexual activity: Not on file  ?Other Topics Concern  ? Not on file  ?Social History Narrative  ? Not on file  ? ?Social Determinants of Health  ? ?Financial Resource Strain: Not on file  ?Food Insecurity: Not on file  ?Transportation Needs: Not on file  ?Physical Activity: Not on file  ?Stress: Not on file  ?Social Connections: Not on file  ? ? ?Allergies:  ?Allergies  ?Allergen Reactions  ? Escitalopram   ?  Burning sensation   ? ? ?Metabolic Disorder Labs: ?Lab Results  ?Component Value Date  ? HGBA1C 5.7 01/05/2021  ? ?No results found for: PROLACTIN ?Lab Results  ?Component Value Date  ? CHOL 241 (H) 01/05/2021  ? TRIG 264 (H) 01/05/2021  ? HDL 57 01/05/2021  ? CHOLHDL 4.2 01/05/2021  ? LDLCALC 137 (H) 01/05/2021  ? ?Lab Results  ?Component Value Date  ? TSH 1.030 01/05/2021  ? ? ?  Therapeutic Level Labs: ?No results found for: LITHIUM ?No results found for: VALPROATE ?No components found for:  CBMZ ? ?Current Medications: ?Current Outpatient Medications  ?Medication Sig Dispense Refill  ? atorvastatin (LIPITOR) 80 MG tablet Take 1 tablet (80 mg total) by mouth daily. 90 tablet 3  ? benzonatate (TESSALON) 100 MG capsule Take 1 capsule (100 mg total) by mouth every 8 (eight) hours as needed for cough. 21 capsule 0  ? ibuprofen (ADVIL) 600 MG tablet Take 1 tablet (600 mg total) by mouth every  6 (six) hours as needed. 30 tablet 0  ? meloxicam (MOBIC) 7.5 MG tablet TAKE 1 TABLET BY MOUTH EVERY DAY 30 tablet 0  ? tiZANidine (ZANAFLEX) 4 MG tablet TAKE 1 TABLET BY MOUTH EVERYDAY AT BEDTIME 30 tablet 0  ? venlafaxine XR (EFFEXOR XR) 37.5 MG 24 hr capsule Take 1 capsule (37.5 mg total) by mouth daily. 30 capsule 1  ? ?No current facility-administered medications for this visit.  ? ? ? ?Musculoskeletal: ?Strength & Muscle Tone: N/A virtual visit ?Gait & Station: N/A virtual visit ?Patient leans: N/A ? ?Psychiatric Specialty Exam: ?Review of Systems  ?Psychiatric/Behavioral:  Negative for hallucinations, self-injury and suicidal ideas.   ?All other systems reviewed and are negative.  ?There were no vitals taken for this visit.There is no height or weight on file to calculate BMI.  ?General Appearance: Well-groomed  ?Eye Contact: Good  ?Speech: Clear and coherent  ?Volume: Normal  ?Mood: Euthymic  ?Affect: Congruent  ?Thought Process: Goal directed  ?Orientation:  Full (Time, Place, and Person)  ?Thought Content: Logical   ?Suicidal Thoughts:  No  ?Homicidal Thoughts:  No  ?Memory: Good  ?Judgement: Good  ?Insight: Good  ?Psychomotor Activity: N/A  ?Concentration: Good  ?Recall:  Good  ?Fund of Knowledge: Good  ?Language: Good  ?Akathisia:  NA  ?Handed:  Right  ?AIMS (if indicated): not done  ?Assets:  Communication Skills ?Desire for Improvement  ?ADL's:  Intact  ?Cognition: WNL  ?Sleep:  Good  ? ?Screenings: ?GAD-7   ? ?Flowsheet Row Counselor from 07/06/2021 in [facility] Office Visit from 05/14/2021 in [facility] Counselor from 03/10/2021 in [facility] Office Visit from 02/10/2021 in [facility] Office Visit from 01/05/2021 in [facility]  ?Total GAD-7 Score 0 14 15 11 12   ? ?  ? ?PHQ2-9   ? ?Flowsheet Row Office Visit from 05/14/2021 in [facility]  Office Visit from 02/10/2021 in [facility] Office Visit from 01/05/2021 in [facility]  ?PHQ-2 Total Score 2 2 2   ?PHQ-9 Total Score 9 6 10   ? ?  ? ?Flowsheet Row Office Visit from 05/14/2021 in [facility] ED from 04/27/2021 in [facility] at [facility]  ED from 03/24/2021 in [facility] Care at [facility]   ?C-SSRS RISK CATEGORY No Risk No Risk No Risk  ? ?  ? ? ? ?Assessment and Plan: Haruye Lainez is a 51 year old female presenting to [facility] behavioral health outpatient for follow-up psychiatric evaluation.  She has a psychiatric history of generalized anxiety disorder and her symptoms are managed with Effexor Exar 37.5 mg daily.  Patient reports medication compliance and states that medications are effective with managing symptoms.  Patient denies adverse effects and denies the need for dose adjustment today.  No medication changes.  Effexor  refilled at current dosage. ? ?Collaboration of Care: Collaboration of Care: Medication Management AEB medication E scribed to patient's preferred pharmacy. ? ? ?1. Generalized anxiety disorder ? ?- venlafaxine XR (EFFEXOR XR) 37.5 MG 24 hr capsule; Take 1 capsule (37.5 mg total) by mouth daily.  Dispense: 30 capsule; Refill: 2  ? ? ?Return to care in 3 months ? ? ? ?Patient/Guardian was advised Release of Information must be obtained prior to any record release in order to collaborate their care with an outside provider. Patient/Guardian was advised if they have not already done so to contact the registration department to sign all necessary forms in order for us to release information regarding their care.  ? ?Consent: Patient/Guardian gives verbal consent for treatment and assignment of benefits for services provided during this visit. Patient/Guardian expressed understanding and agreed to proceed.  ? ? ?Mcneil Sobericely Dameshia Seybold, NP ?07/07/2021, 8:40 AM ? ?

## 2021-09-06 ENCOUNTER — Encounter: Payer: Self-pay | Admitting: Emergency Medicine

## 2021-09-06 ENCOUNTER — Other Ambulatory Visit: Payer: Self-pay

## 2021-09-06 ENCOUNTER — Ambulatory Visit
Admission: EM | Admit: 2021-09-06 | Discharge: 2021-09-06 | Disposition: A | Discharge status: Home / Self Care | Payer: 59 | Attending: Emergency Medicine | Admitting: Emergency Medicine

## 2021-09-06 DIAGNOSIS — R300 Dysuria: Secondary | ICD-10-CM | POA: Insufficient documentation

## 2021-09-06 DIAGNOSIS — J309 Allergic rhinitis, unspecified: Secondary | ICD-10-CM | POA: Insufficient documentation

## 2021-09-06 LAB — POCT URINALYSIS DIP (MANUAL ENTRY)
Bilirubin, UA: NEGATIVE
Blood, UA: NEGATIVE
Glucose, UA: NEGATIVE mg/dL
Ketones, POC UA: NEGATIVE mg/dL
Leukocytes, UA: NEGATIVE
Nitrite, UA: NEGATIVE
Protein Ur, POC: NEGATIVE mg/dL
Spec Grav, UA: >=1.03 — AB (ref 1.010–1.025)
Urobilinogen, UA: 0.2 E.U./dL
pH, UA: 5.5 (ref 5.0–8.0)

## 2021-09-06 LAB — POCT URINE PREGNANCY: Preg Test, Ur: NEGATIVE

## 2021-09-06 MED ORDER — CETIRIZINE HCL 10 MG PO TABS: 10.0000 mg | ORAL_TABLET | Freq: Every day | ORAL | 2 refills | Status: DC

## 2021-09-06 MED ORDER — FLUTICASONE PROPIONATE 50 MCG/ACT NA SUSP: 1.0000 | Freq: Every day | NASAL | 1 refills | Status: DC

## 2021-09-06 MED ORDER — SULFAMETHOXAZOLE-TRIMETHOPRIM 800-160 MG PO TABS: 1.0000 | ORAL_TABLET | Freq: Two times a day (BID) | ORAL | 0 refills | Status: AC

## 2021-09-06 NOTE — ED Triage Notes (Signed)
Pt sts cough x 2 weeks not improving; pt sts started after cleaning her rugs; pt sts dysuria x 2 days

## 2021-09-06 NOTE — ED Provider Notes (Signed)
[facility]    CSN: [id number] Arrival date & time: 09/06/21  1221    HISTORY   Chief Complaint  Patient presents with   Cough   Dysuria   HPI Sheila Zhang is a 51 y.o. female. Pt presents to urgent care today complaining of cough x 2 weeks which is not improving; pt sts her symptoms started after cleaning her rugs.  Patient endorses a history of allergies, not currently taking any allergy medication.  States cough is not productive, worse at night.  Patient denies shortness of breath, fever, aches, chills, nausea, vomiting, diarrhea, sore throat, known sick contacts.  Patient complains of burning with urination for the past 2 days, denies increased frequency of urination, urgency, hesitancy, incomplete emptying.  Patient states she has had similar symptoms in the past.  The history is provided by the patient.  Past Medical History:  Diagnosis Date   Anxiety    Patient Active Problem List   Diagnosis Date Noted   Lateral epicondylitis, left elbow 05/12/2021   Generalized anxiety disorder 03/14/2021   History reviewed. No pertinent surgical history. OB History   No obstetric history on file.    Home Medications    Prior to Admission medications   Medication Sig Start Date End Date Taking? Authorizing Provider  atorvastatin (LIPITOR) 80 MG tablet Take 1 tablet (80 mg total) by mouth daily. 01/09/21   Grayce Sessions, NP  benzonatate (TESSALON) 100 MG capsule Take 1 capsule (100 mg total) by mouth every 8 (eight) hours as needed for cough. Patient not taking: Reported on 09/06/2021 03/24/21   Gustavus Bryant, FNP  ibuprofen (ADVIL) 600 MG tablet Take 1 tablet (600 mg total) by mouth every 6 (six) hours as needed. 01/19/21   Ivette Loyal, NP  meloxicam (MOBIC) 7.5 MG tablet TAKE 1 TABLET BY MOUTH EVERY DAY 06/08/21   Marlyne Beards, MD  tiZANidine (ZANAFLEX) 4 MG tablet TAKE 1 TABLET BY MOUTH EVERYDAY AT BEDTIME 03/27/21   Grayce Sessions, NP   venlafaxine XR (EFFEXOR XR) 37.5 MG 24 hr capsule Take 1 capsule (37.5 mg total) by mouth daily. 07/07/21 07/07/22  Mcneil Sober, NP   Family History History reviewed. No pertinent family history. Social History Social History   Tobacco Use   Smoking status: Never   Smokeless tobacco: Never  Substance Use Topics   Alcohol use: Never   Drug use: Never   Allergies   Escitalopram  Review of Systems Review of Systems Pertinent findings noted in history of present illness.   Physical Exam Triage Vital Signs ED Triage Vitals  Enc Vitals Group     BP 02/06/21 0827 (!) 147/82     Pulse Rate 02/06/21 0827 72     Resp 02/06/21 0827 18     Temp 02/06/21 0827 98.3 F (36.8 C)     Temp Source 02/06/21 0827 Oral     SpO2 02/06/21 0827 98 %     Weight --      Height --      Head Circumference --      Peak Flow --      Pain Score 02/06/21 0826 5     Pain Loc --      Pain Edu? --      Excl. in GC? --   No data found.  Updated Vital Signs BP 130/85 (BP Location: Left Arm)   Pulse 92   Temp 98.1 F (36.7 C) (Oral)   Resp 18  SpO2 95%   Physical Exam Vitals and nursing note reviewed.  Constitutional:      General: She is not in acute distress.    Appearance: Normal appearance. She is not ill-appearing.  HENT:     Head: Normocephalic and atraumatic.     Salivary Glands: Right salivary gland is not diffusely enlarged or tender. Left salivary gland is not diffusely enlarged or tender.     Right Ear: Ear canal and external ear normal. No drainage. A middle ear effusion is present. There is no impacted cerumen. Tympanic membrane is bulging. Tympanic membrane is not injected or erythematous.     Left Ear: Ear canal and external ear normal. No drainage. A middle ear effusion is present. There is no impacted cerumen. Tympanic membrane is bulging. Tympanic membrane is not injected or erythematous.     Ears:     Comments: Bilateral EACs normal, both TMs bulging with clear fluid     Nose: Rhinorrhea present. No nasal deformity, septal deviation, signs of injury, nasal tenderness, mucosal edema or congestion. Rhinorrhea is clear.     Right Nostril: Occlusion present. No foreign body, epistaxis or septal hematoma.     Left Nostril: Occlusion present. No foreign body, epistaxis or septal hematoma.     Right Turbinates: Enlarged, swollen and pale.     Left Turbinates: Enlarged, swollen and pale.     Right Sinus: No maxillary sinus tenderness or frontal sinus tenderness.     Left Sinus: No maxillary sinus tenderness or frontal sinus tenderness.     Mouth/Throat:     Lips: Pink. No lesions.     Mouth: Mucous membranes are moist. No oral lesions.     Pharynx: Oropharynx is clear. Uvula midline. No posterior oropharyngeal erythema or uvula swelling.     Tonsils: No tonsillar exudate. 0 on the right. 0 on the left.     Comments: Postnasal drip Eyes:     General: Lids are normal.        Right eye: No discharge.        Left eye: No discharge.     Extraocular Movements: Extraocular movements intact.     Conjunctiva/sclera: Conjunctivae normal.     Right eye: Right conjunctiva is not injected.     Left eye: Left conjunctiva is not injected.  Neck:     Trachea: Trachea and phonation normal.  Cardiovascular:     Rate and Rhythm: Normal rate and regular rhythm.     Pulses: Normal pulses.     Heart sounds: Normal heart sounds. No murmur heard.   No friction rub. No gallop.  Pulmonary:     Effort: Pulmonary effort is normal. No accessory muscle usage, prolonged expiration or respiratory distress.     Breath sounds: Normal breath sounds. No stridor, decreased air movement or transmitted upper airway sounds. No decreased breath sounds, wheezing, rhonchi or rales.  Chest:     Chest wall: No tenderness.  Abdominal:     General: Abdomen is flat. Bowel sounds are normal. There is no distension.     Palpations: Abdomen is soft. There is no mass.     Tenderness: There is no abdominal  tenderness. There is no right CVA tenderness, left CVA tenderness, guarding or rebound.     Hernia: No hernia is present.  Musculoskeletal:        General: Normal range of motion.     Cervical back: Normal range of motion and neck supple. Normal range of motion.  Lymphadenopathy:  Cervical: No cervical adenopathy.  Skin:    General: Skin is warm and dry.     Findings: No erythema or rash.  Neurological:     General: No focal deficit present.     Mental Status: She is alert and oriented to person, place, and time.  Psychiatric:        Mood and Affect: Mood normal.        Behavior: Behavior normal.    Visual Acuity Right Eye Distance:   Left Eye Distance:   Bilateral Distance:    Right Eye Near:   Left Eye Near:    Bilateral Near:     UC Couse / Diagnostics / Procedures:    EKG  Radiology No results found.  Procedures Procedures (including critical care time)  UC Diagnoses / Final Clinical Impressions(s)   I have reviewed the triage vital signs and the nursing notes.  Pertinent labs & imaging results that were available during my care of the patient were reviewed by me and considered in my medical decision making (see chart for details).    Final diagnoses:  Dysuria  Allergic rhinitis, unspecified seasonality, unspecified trigger   Urine dip today was normal.  Urine culture will be performed due to patient having active symptoms of urinary tract infection.   Patient was advised to begin antibiotics today due to having active symptoms of urinary tract infection.                      Patient was advised to take all doses exactly as prescribed.  Patient also advised of risks of worsening infection with incomplete antibiotic therapy.   Patient advised that they will be contacted with results and that adjustments to treatment will be provided as indicated based on the results.   Patient was advised of possibility that urine culture results may be negative if sample  provided was obtained late in the day causing urine to be more diluted.  Patient was advised that if antibiotics were effective after the first 24 to 36 hours, despite negative urine culture result, it is recommended that they complete the full course as prescribed.     Patient advised that they will be contacted with results of the urine culture and that treatment will be provided as indicated based on results.   Return precautions advised.  ED Prescriptions     Medication Sig Dispense Auth. Provider   sulfamethoxazole-trimethoprim (BACTRIM DS) 800-160 MG tablet Take 1 tablet by mouth 2 (two) times daily for 3 days. 6 tablet Lynden Oxford Scales, PA-C   cetirizine (ZYRTEC ALLERGY) 10 MG tablet Take 1 tablet (10 mg total) by mouth at bedtime. 30 tablet Lynden Oxford Scales, PA-C   fluticasone (FLONASE) 50 MCG/ACT nasal spray Place 1 spray into both nostrils daily. Begin by using 2 sprays in each nare daily for 3 to 5 days, then decrease to 1 spray in each nare daily. 32 mL Lynden Oxford Scales, PA-C      PDMP not reviewed this encounter.  Pending results:  Labs Reviewed  POCT URINALYSIS DIP (MANUAL ENTRY) - Abnormal; Notable for the following components:      Result Value   Spec Grav, UA >=1.030 (*)    All other components within normal limits  URINE CULTURE  POCT URINE PREGNANCY    Medications Ordered in UC: Medications - No data to display  Disposition Upon Discharge:  Condition: stable for discharge home  Patient presented with concern for an acute illness  with associated systemic symptoms and significant discomfort requiring urgent management. In my opinion, this is a condition that a prudent lay person (someone who possesses an average knowledge of health and medicine) may potentially expect to result in complications if not addressed urgently such as respiratory distress, impairment of bodily function or dysfunction of bodily organs.   As such, the patient has been  evaluated and assessed, work-up was performed and treatment was provided in alignment with urgent care protocols and evidence based medicine.  Patient/parent/caregiver has been advised that the patient may require follow up for further testing and/or treatment if the symptoms continue in spite of treatment, as clinically indicated and appropriate.  Routine symptom specific, illness specific and/or disease specific instructions were discussed with the patient and/or caregiver at length.  Prevention strategies for avoiding STD exposure were also discussed.  The patient will follow up with their current PCP if and as advised. If the patient does not currently have a PCP we will assist them in obtaining one.   The patient may need specialty follow up if the symptoms continue, in spite of conservative treatment and management, for further workup, evaluation, consultation and treatment as clinically indicated and appropriate.  Patient/parent/caregiver verbalized understanding and agreement of plan as discussed.  All questions were addressed during visit.  Please see discharge instructions below for further details of plan.  Discharge Instructions:   Discharge Instructions      Your symptoms and my physical exam findings are concerning for exacerbation of your underlying allergies.  It is important that you begin your allergy regimen now and are consistent with taking allergy medications exactly as prescribed.  Allergy medications are preventative and therefore only work well when they are taken daily, not "as needed".   Please see the list below for recommended medications, dosages and frequencies to provide relief of current symptoms:     Zyrtec (cetirizine): This is an excellent second-generation antihistamine that helps to reduce respiratory inflammatory response to environmental allergens.  In some patients, this medication can cause daytime sleepiness so I recommend that you take 1 tablet daily at  bedtime.     Flonase (fluticasone): This is a steroid nasal spray that you use once daily, 1 spray in each nare.  This medication does not work well if you decide to use it only used as you feel you need to, it works best used on a daily basis.  After 3 to 5 days of use, you will notice significant reduction of the inflammation and mucus production that is currently being caused by exposure to allergens, whether seasonal or environmental.  The most common side effect of this medication is nosebleeds.  If you experience a nosebleed, please discontinue use for 1 week, then feel free to resume.  I have provided you with a prescription.     If you find that you have not had improvement of your symptoms in the next 5 to 7 days, please follow-up with your primary care provider or return here to urgent care for repeat evaluation and further recommendations.  The urinalysis that we performed in the clinic today was normal.  Because you are having symptoms, urine culture will be performed.  The results of the urine culture will be available in the next 3 to 5 days and will be posted to your MyChart account.  If there is an abnormal finding, you will be contacted by phone and advised of further treatment recommendations, if any.   You were advised to begin  antibiotics today because you are having active symptoms of a urinary tract infection.  It is very important that you take all doses exactly as prescribed.  Incomplete antibiotic therapy can cause worsening urinary tract infection that can become aggressive, reach the level of your kidneys causing kidney infection and possible hospitalization.   If you receive a phone call advising you that your urine culture is negative but you are feeling significantly better after starting antibiotics, I recommend that you complete the full course of antibiotics as prescribed.     If you receive a phone call advising you that your urine culture is negative and you are not  feeling significantly better after starting antibiotics, please feel free to discontinue antibiotics as they are no longer indicated.   If you have not had complete resolution of your symptoms after completing treatment as prescribed, please return to urgent care for repeat evaluation or follow-up with your primary care provider.   Thank you for visiting urgent care today.  We appreciate the opportunity to participate in your care.       This office note has been dictated using Museum/gallery curator.  Unfortunately, and despite my best efforts, this method of dictation can sometimes lead to occasional typographical or grammatical errors.  I apologize in advance if this occurs.      Lynden Oxford Scales, [place] 09/07/21 [phone]

## 2021-09-06 NOTE — Discharge Instructions (Signed)
Your symptoms and my physical exam findings are concerning for exacerbation of your underlying allergies.  It is important that you begin your allergy regimen now and are consistent with taking allergy medications exactly as prescribed.  Allergy medications are preventative and therefore only work well when they are taken daily, not "as needed".   Please see the list below for recommended medications, dosages and frequencies to provide relief of current symptoms:     Zyrtec (cetirizine): This is an excellent second-generation antihistamine that helps to reduce respiratory inflammatory response to environmental allergens.  In some patients, this medication can cause daytime sleepiness so I recommend that you take 1 tablet daily at bedtime.     Flonase (fluticasone): This is a steroid nasal spray that you use once daily, 1 spray in each nare.  This medication does not work well if you decide to use it only used as you feel you need to, it works best used on a daily basis.  After 3 to 5 days of use, you will notice significant reduction of the inflammation and mucus production that is currently being caused by exposure to allergens, whether seasonal or environmental.  The most common side effect of this medication is nosebleeds.  If you experience a nosebleed, please discontinue use for 1 week, then feel free to resume.  I have provided you with a prescription.     If you find that you have not had improvement of your symptoms in the next 5 to 7 days, please follow-up with your primary care provider or return here to urgent care for repeat evaluation and further recommendations.  The urinalysis that we performed in the clinic today was normal.  Because you are having symptoms, urine culture will be performed.  The results of the urine culture will be available in the next 3 to 5 days and will be posted to your MyChart account.  If there is an abnormal finding, you will be contacted by phone and advised of  further treatment recommendations, if any.   You were advised to begin antibiotics today because you are having active symptoms of a urinary tract infection.  It is very important that you take all doses exactly as prescribed.  Incomplete antibiotic therapy can cause worsening urinary tract infection that can become aggressive, reach the level of your kidneys causing kidney infection and possible hospitalization.   If you receive a phone call advising you that your urine culture is negative but you are feeling significantly better after starting antibiotics, I recommend that you complete the full course of antibiotics as prescribed.     If you receive a phone call advising you that your urine culture is negative and you are not feeling significantly better after starting antibiotics, please feel free to discontinue antibiotics as they are no longer indicated.   If you have not had complete resolution of your symptoms after completing treatment as prescribed, please return to urgent care for repeat evaluation or follow-up with your primary care provider.   Thank you for visiting urgent care today.  We appreciate the opportunity to participate in your care.

## 2021-09-07 LAB — URINE CULTURE: Culture: <10000 — AB

## 2021-09-09 ENCOUNTER — Ambulatory Visit (INDEPENDENT_AMBULATORY_CARE_PROVIDER_SITE_OTHER): Payer: 59 | Admitting: Primary Care

## 2021-09-10 ENCOUNTER — Encounter (HOSPITAL_COMMUNITY): Payer: Self-pay

## 2021-09-10 ENCOUNTER — Ambulatory Visit (HOSPITAL_COMMUNITY): Payer: Medicaid Other | Admitting: Clinical

## 2021-09-11 ENCOUNTER — Telehealth (INDEPENDENT_AMBULATORY_CARE_PROVIDER_SITE_OTHER): Payer: Self-pay | Admitting: Primary Care

## 2021-09-11 NOTE — Telephone Encounter (Signed)
Spoke with patient someone already provided the number to her and she has rescheduled her appt.

## 2021-09-11 NOTE — Telephone Encounter (Signed)
Copied from Bee [id number]. Topic: General - Other >> Sep 11, 2021  8:58 AM Camille Bal, Gerlene Burdock wrote: Reason for CRM:pt called in trying to get number for [facility] counseling, referred by Dr Oletta Lamas, she was supposed to have video visit yesterday ,06/01 that she missed. Please call back with phone number if possible to reschedule. She couldn't remember the name or anything of the provider.

## 2021-09-22 ENCOUNTER — Encounter (HOSPITAL_COMMUNITY): Payer: Self-pay | Admitting: Psychiatry

## 2021-09-22 ENCOUNTER — Telehealth (INDEPENDENT_AMBULATORY_CARE_PROVIDER_SITE_OTHER): Payer: 59 | Admitting: Psychiatry

## 2021-09-22 ENCOUNTER — Telehealth (HOSPITAL_COMMUNITY): Payer: 59 | Admitting: Psychiatry

## 2021-09-22 DIAGNOSIS — F411 Generalized anxiety disorder: Secondary | ICD-10-CM | POA: Diagnosis not present

## 2021-09-22 MED ORDER — VENLAFAXINE HCL ER 37.5 MG PO CP24: 37.5000 mg | ORAL_CAPSULE | Freq: Every day | ORAL | 3 refills | Status: DC

## 2021-09-22 NOTE — Progress Notes (Signed)
BH MD/PA/NP OP Progress Note Virtual Visit via Telephone Note  I connected with Sheila Zhang on 09/22/21 at  9:00 AM EDT by telephone and verified that I am speaking with the correct person using two identifiers.  Location: Patient: Work Provider: Clinic   I discussed the limitations, risks, security and privacy concerns of performing an evaluation and management service by telephone and the availability of in person appointments. I also discussed with the patient that there may be a patient responsible charge related to this service. The patient expressed understanding and agreed to proceed.   I provided 30 minutes of non-face-to-face time during this encounter.  09/22/2021 9:15 AM Sheila MarkJacqueline Hammock  MRN:  [id number]  Chief Complaint: "The medications are working okay"  HPI: 51 year old female seen today for follow-up psychiatric evaluation.  She has a psychiatric history of anxiety.  She is currently being managed on Effexor 37.5 mg.  She notes her medications are effective in managing her psychiatric condition.  Today patient was unable to logon virtually so assessment done over the phone.  During exam she was pleasant, cooperative, and engaged in conversation.  She informed Clinical research associatewriter that her medications are working well and notes that she feels mentally stable.  She however reports that she has increased anxiety due to life stressors.  Patient informed Clinical research associatewriter that her husband is currently unemployed and she is concerned about finances and personal issues.  She however reports that she is able to cope with it.  Provider conducted a GAD-7 and patient scored a 17.  Provider also conducted PHQ-9 and patient scored an 8.  She endorsed adequate sleep and increased appetite.  Patient notes that she has gained a few pounds since her last visit.  Today she denies SI/HI/AVH, mania, or paranoia.  No medication changes made today.  Patient agreeable to taking medications as prescribed.  No  other concerns noted at this time. Visit Diagnosis: No diagnosis found.  Past Psychiatric History: Anxiety  Past Medical History:  Past Medical History:  Diagnosis Date   Anxiety    History reviewed. No pertinent surgical history.  Family Psychiatric History: Mother: anxiety, suicidal ideations, depression. Sister: anxiety.  Family History: History reviewed. No pertinent family history.  Social History:  Social History   Socioeconomic History   Marital status: Single    Spouse name: Not on file   Number of children: Not on file   Years of education: Not on file   Highest education level: Not on file  Occupational History   Not on file  Tobacco Use   Smoking status: Never   Smokeless tobacco: Never  Substance and Sexual Activity   Alcohol use: Never   Drug use: Never   Sexual activity: Not on file  Other Topics Concern   Not on file  Social History Narrative   Not on file   Social Determinants of Health   Financial Resource Strain: Not on file  Food Insecurity: Not on file  Transportation Needs: Not on file  Physical Activity: Not on file  Stress: Not on file  Social Connections: Not on file    Allergies:  Allergies  Allergen Reactions   Escitalopram     Burning sensation     Metabolic Disorder Labs: Lab Results  Component Value Date   HGBA1C 5.7 01/05/2021   No results found for: "PROLACTIN" Lab Results  Component Value Date   CHOL 241 (H) 01/05/2021   TRIG 264 (H) 01/05/2021   HDL 57 01/05/2021   CHOLHDL 4.2  01/05/2021   LDLCALC 137 (H) 01/05/2021   Lab Results  Component Value Date   TSH 1.030 01/05/2021    Therapeutic Level Labs: No results found for: "LITHIUM" No results found for: "VALPROATE" No results found for: "CBMZ"  Current Medications: Current Outpatient Medications  Medication Sig Dispense Refill   atorvastatin (LIPITOR) 80 MG tablet Take 1 tablet (80 mg total) by mouth daily. 90 tablet 3   cetirizine (ZYRTEC ALLERGY) 10  MG tablet Take 1 tablet (10 mg total) by mouth at bedtime. 30 tablet 2   fluticasone (FLONASE) 50 MCG/ACT nasal spray Place 1 spray into both nostrils daily. Begin by using 2 sprays in each nare daily for 3 to 5 days, then decrease to 1 spray in each nare daily. 32 mL 1   meloxicam (MOBIC) 7.5 MG tablet TAKE 1 TABLET BY MOUTH EVERY DAY 30 tablet 0   tiZANidine (ZANAFLEX) 4 MG tablet TAKE 1 TABLET BY MOUTH EVERYDAY AT BEDTIME 30 tablet 0   venlafaxine XR (EFFEXOR XR) 37.5 MG 24 hr capsule Take 1 capsule (37.5 mg total) by mouth daily. 30 capsule 3   No current facility-administered medications for this visit.     Musculoskeletal: Strength & Muscle Tone:  Unable to assess due to telehealth visit Gait & Station:  Unable to assess due to telehealth visit Patient leans: N/A  Psychiatric Specialty Exam: Review of Systems  There were no vitals taken for this visit.There is no height or weight on file to calculate BMI.  General Appearance:  Unable to assess due to telehealth visit  Eye Contact:   Unable to assess due to telehealth visit  Speech:  Clear and Coherent and Normal Rate  Volume:  Normal  Mood:  Anxious and reports anxiety is situational and notes that she is able to cope with it  Affect:  Appropriate and Congruent  Thought Process:  Coherent, Goal Directed, and Linear  Orientation:  Full (Time, Place, and Person)  Thought Content: WDL and Logical   Suicidal Thoughts:  No  Homicidal Thoughts:  No  Memory:  Immediate;   Good Recent;   Good Remote;   Good  Judgement:  Good  Insight:  Good  Psychomotor Activity:   Unable to assess due to telehealth visit  Concentration:  Concentration: Good and Attention Span: Good  Recall:  Good  Fund of Knowledge: Good  Language: Good  Akathisia:   Unable to assess due to telehealth visit  Handed:  Right  AIMS (if indicated): not done  Assets:  Communication Skills Desire for Improvement Financial  Resources/Insurance Housing Intimacy Physical Health Social Support Transportation Vocational/Educational  ADL's:  Intact  Cognition: WNL  Sleep:  Good   Screenings: GAD-7    Flowsheet Row Video Visit from 09/22/2021 in [facility] Counselor from 07/06/2021 in [facility] Office Visit from 05/14/2021 in [facility] Counselor from 03/10/2021 in [facility] Office Visit from 02/10/2021 in [facility]  Total GAD-7 Score 17 0 14 15 11       PHQ2-9    Flowsheet Row Video Visit from 09/22/2021 in [facility] Office Visit from 05/14/2021 in [facility] Office Visit from 02/10/2021 in [facility] Office Visit from 01/05/2021 in [facility]  PHQ-2 Total Score 2 2 2 2   PHQ-9 Total Score 8 9 6 10       Flowsheet Row  Video Visit from 09/22/2021 in [facility] ED from 09/06/2021 in [facility]  Office Visit from 05/14/2021 in [facility]  C-SSRS RISK CATEGORY No Risk No Risk No Risk        Assessment and Plan: Patient notes that she is doing well on her current medication regimen.No medication changes made today.  Patient agreeable to taking medications as prescribed.    1. Generalized anxiety disorder  Continue- venlafaxine XR (EFFEXOR XR) 37.5 MG 24 hr capsule; Take 1 capsule (37.5 mg total) by mouth daily.  Dispense: 30 capsule; Refill: 3  Collaboration of Care: Collaboration of Care: Other provider involved in patient's care AEB counselor  Patient/Guardian was advised Release of Information must be obtained prior to any record release in order to collaborate their care with an outside provider. Patient/Guardian was advised if they have not already done so to contact the  registration department to sign all necessary forms in order for [place] to release information regarding their care.   Consent: Patient/Guardian gives verbal consent for treatment and assignment of benefits for services provided during this visit. Patient/Guardian expressed understanding and agreed to proceed.   Follow-up in 3 months Follow-up with therapy  Shanna Cisco, NP 09/22/2021, 9:15 AM

## 2021-09-26 ENCOUNTER — Emergency Department (HOSPITAL_COMMUNITY): Payer: Commercial Managed Care - HMO

## 2021-09-26 ENCOUNTER — Emergency Department (HOSPITAL_COMMUNITY)
Admission: EM | Admit: 2021-09-26 | Discharge: 2021-09-26 | Disposition: A | Discharge status: Home / Self Care | Payer: Commercial Managed Care - HMO | Attending: Emergency Medicine | Admitting: Emergency Medicine

## 2021-09-26 ENCOUNTER — Other Ambulatory Visit: Payer: Self-pay

## 2021-09-26 ENCOUNTER — Encounter (HOSPITAL_COMMUNITY): Payer: Self-pay | Admitting: Emergency Medicine

## 2021-09-26 DIAGNOSIS — M25561 Pain in right knee: Secondary | ICD-10-CM | POA: Diagnosis present

## 2021-09-26 NOTE — ED Triage Notes (Signed)
Patient c/o R knee pain today at work. Previous injury to same. Denies new trauma/injury. Ambulatory.

## 2021-09-26 NOTE — Discharge Instructions (Addendum)
Please follow-up with an orthopedist.  Ice your knee, elevate and take Tylenol and ibuprofen  Please use Tylenol or ibuprofen for pain.  You may use 600 mg ibuprofen every 6 hours or 1000 mg of Tylenol every 6 hours.  You may choose to alternate between the 2.  This would be most effective.  Not to exceed 4 g of Tylenol within 24 hours.  Not to exceed 3200 mg ibuprofen 24 hours.

## 2021-09-26 NOTE — ED Provider Notes (Signed)
[facility]EMERGENCY DEPT Provider Note   CSN: [id number] Arrival date & time: 09/26/21  1813     History  Chief Complaint  Patient presents with   Knee Pain    Sheila Zhang is a 50 y.o. female.   Knee Pain  51 year old non-traumatic R knee pain  History of of similar.  Seems that it is flared up today.     Home Medications Prior to Admission medications   Medication Sig Start Date End Date Taking? Authorizing Provider  atorvastatin (LIPITOR) 80 MG tablet Take 1 tablet (80 mg total) by mouth daily. 01/09/21   Grayce Sessions, NP  cetirizine (ZYRTEC ALLERGY) 10 MG tablet Take 1 tablet (10 mg total) by mouth at bedtime. 09/06/21 12/05/21  Theadora Rama Scales, PA-C  fluticasone (FLONASE) 50 MCG/ACT nasal spray Place 1 spray into both nostrils daily. Begin by using 2 sprays in each nare daily for 3 to 5 days, then decrease to 1 spray in each nare daily. 09/06/21   Theadora Rama Scales, PA-C  meloxicam (MOBIC) 7.5 MG tablet TAKE 1 TABLET BY MOUTH EVERY DAY 06/08/21   Marlyne Beards, MD  tiZANidine (ZANAFLEX) 4 MG tablet TAKE 1 TABLET BY MOUTH EVERYDAY AT BEDTIME 03/27/21   Grayce Sessions, NP  venlafaxine XR (EFFEXOR XR) 37.5 MG 24 hr capsule Take 1 capsule (37.5 mg total) by mouth daily. 09/22/21   Shanna Cisco, NP      Allergies    Escitalopram    Review of Systems   Review of Systems  Physical Exam Updated Vital Signs BP 139/76   Pulse 72   Temp 98.3 F (36.8 C) (Oral)   Resp 18   SpO2 94%  Physical Exam Vitals and nursing note reviewed.  Constitutional:      General: She is not in acute distress.    Appearance: Normal appearance. She is not ill-appearing.  HENT:     Head: Normocephalic and atraumatic.  Eyes:     General: No scleral icterus.       Right eye: No discharge.        Left eye: No discharge.     Conjunctiva/sclera: Conjunctivae normal.  Pulmonary:     Effort: Pulmonary effort is normal.     Breath  sounds: No stridor.  Musculoskeletal:     Comments: R knee without significant TTP.   FROM of knee  No redness or swelling.  No erythematous changes of skin.  Neurological:     Mental Status: She is alert and oriented to person, place, and time. Mental status is at baseline.     ED Results / Procedures / Treatments   Labs (all labs ordered are listed, but only abnormal results are displayed) Labs Reviewed - No data to display  EKG None  Radiology DG Knee Complete 4 Views Right  Result Date: 09/26/2021 CLINICAL DATA:  Generalized knee pain.  No known injury EXAM: RIGHT KNEE - COMPLETE 4+ VIEW COMPARISON:  None Available. FINDINGS: No evidence of fracture, dislocation, or joint effusion. Tiny marginal osteophytes with preservation of the joint spaces. 4 mm probable loose body at the posterior joint line. Soft tissues are unremarkable. IMPRESSION: 1. No acute osseous abnormality. 2. Early mild degenerative changes of the right knee. 4 mm probable loose body at the posterior joint line. Electronically Signed   By: Duanne Guess D.O.   On: 09/26/2021 19:08    Procedures Procedures    Medications Ordered in ED Medications - No data  to display  ED Course/ Medical Decision Making/ A&P                           Medical Decision Making Amount and/or Complexity of Data Reviewed Radiology: ordered.   51 year old non-traumatic R knee pain  History of of similar.  Seems that it is flared up today.  IMPRESSION:  1. No acute osseous abnormality.  2. Early mild degenerative changes of the right knee. 4 mm probable  loose body at the posterior joint line.      Electronically Signed    By: Duanne Guess D.O.    On: 09/26/2021 19:08   I personally viewed images of x-ray and agree radiology read.  Considered meniscal injury as she is having some clicking with ambulation.  Seems to be a stable knee.  No fractures.  Return precautions discussed we will follow-up with  orthopedics.  Placed in knee brace and offered crutches which she declined.   Final Clinical Impression(s) / ED Diagnoses Final diagnoses:  Acute pain of right knee    Rx / DC Orders ED Discharge Orders     None         Gailen Shelter, [place] 09/26/21 2037    Charlynne Pander, MD 09/26/21 2241

## 2021-09-26 NOTE — Progress Notes (Signed)
Orthopedic Tech Progress Note Patient Details:  Sheila Zhang [birth year] [id number]  Ortho Devices Type of Ortho Device: Knee Immobilizer Ortho Device/Splint Interventions: Application   Post Interventions Patient Tolerated: Well, Ambulated well Instructions Provided: Care of device, Adjustment of device  Saul Fordyce 09/26/2021, 8:41 PM

## 2021-09-28 ENCOUNTER — Encounter (INDEPENDENT_AMBULATORY_CARE_PROVIDER_SITE_OTHER): Payer: Self-pay | Admitting: Primary Care

## 2021-09-28 ENCOUNTER — Ambulatory Visit (INDEPENDENT_AMBULATORY_CARE_PROVIDER_SITE_OTHER): Payer: 59 | Admitting: Primary Care

## 2021-09-28 VITALS — BP 114/78 | HR 78 | Temp 98.2°F | Ht 64.0 in | Wt 216.2 lb

## 2021-09-28 DIAGNOSIS — M25561 Pain in right knee: Secondary | ICD-10-CM

## 2021-09-28 DIAGNOSIS — Z8049 Family history of malignant neoplasm of other genital organs: Secondary | ICD-10-CM

## 2021-09-28 DIAGNOSIS — Z09 Encounter for follow-up examination after completed treatment for conditions other than malignant neoplasm: Secondary | ICD-10-CM | POA: Diagnosis not present

## 2021-09-28 NOTE — Progress Notes (Signed)
[facility]   Subjective:   Sheila Zhang is a 51 y.o. female presents for ED follow up. Presented to the ED on 09/26/21,  right acute pain (hx of trauma about 14 years ago. 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 Patient was discharged from the ED on 09/26/21, dx with Acute pain of right knee -place a knee brace on her and referred to [facility]. Today she feels ok rates her pain 2/10 at rest but when walk she states her knee locks up on her and unable tpo bend. Patient has No headache, No chest pain, No abdominal pain - No Nausea, No new weakness tingling or numbness, No Cough - shortness of breath. Past Medical History:  Diagnosis Date   Anxiety     Allergies  Allergen Reactions   Escitalopram     Burning sensation    Current Outpatient Medications on File Prior to Visit  Medication Sig Dispense Refill   atorvastatin (LIPITOR) 80 MG tablet Take 1 tablet (80 mg total) by mouth daily. 90 tablet 3   cetirizine (ZYRTEC ALLERGY) 10 MG tablet Take 1 tablet (10 mg total) by mouth at bedtime. 30 tablet 2   fluticasone (FLONASE) 50 MCG/ACT nasal spray Place 1 spray into both nostrils daily. Begin by using 2 sprays in each nare daily for 3 to 5 days, then decrease to 1 spray in each nare daily. 32 mL 1   meloxicam (MOBIC) 7.5 MG tablet TAKE 1 TABLET BY MOUTH EVERY DAY 30 tablet 0   tiZANidine (ZANAFLEX) 4 MG tablet TAKE 1 TABLET BY MOUTH EVERYDAY AT BEDTIME 30 tablet 0   venlafaxine XR (EFFEXOR XR) 37.5 MG 24 hr capsule Take 1 capsule (37.5 mg total) by mouth daily. 30 capsule 3   No current facility-administered medications on file prior to visit.     Review of System: ROS  Objective:  BP 114/78   Pulse 78   Temp 98.2 F (36.8 C) (Oral)   Ht 5\' 4"  (1.626 m)   Wt 216 lb 3.2 oz (98.1 kg)   SpO2 97%   BMI 37.11 kg/m   Filed Weights   09/28/21 1333  Weight: 216 lb 3.2 oz (98.1 kg)    Physical  Exam: General Appearance: Well nourished, in no apparent distress. Eyes: PERRLA, EOMs, conjunctiva no swelling or erythema Sinuses: No Frontal/maxillary tenderness ENT/Mouth: Ext aud canals clear, TMs without erythema, bulging. No erythema, swelling, or exudate on post pharynx.  Tonsils not swollen or erythematous. Hearing normal.  Neck: Supple, thyroid normal.  Respiratory: Respiratory effort normal, BS equal bilaterally without rales, rhonchi, wheezing or stridor.  Cardio: RRR with no MRGs. Brisk peripheral pulses without edema.  Abdomen: Soft, + BS.  Non tender, no guarding, rebound, hernias, masses. Lymphatics: Non tender without lymphadenopathy.  Musculoskeletal: Full ROM, 5/5 strength upper , abnormal gait-knee brace right knee. Skin: Warm, dry without rashes, lesions, ecchymosis.  Neuro: Cranial nerves intact. Normal muscle tone, no cerebellar symptoms. Sensation intact.  Psych: Awake and oriented X 3, normal affect, Insight and Judgment appropriate.    Assessment:    Sheila Zhang was seen today for hospitalization follow-up.  Diagnoses and all orders for this visit:  Hospital discharge follow-up Retrieved from d/c summary  Follow-Ups: Schedule an appointment with [facility] (Specialist)  Acute pain of right knee IMPRESSION: 1. No acute osseous abnormality. 2. Early mild degenerative changes of the right knee. 4 mm  probable loose body at the posterior joint line. Continue to wear knee brace until seen by ortho Patient requested Dr. Eulah Zhang   Family history of cervical cancer Yearly pap due 03/06/22  This note has been created with Dragon [employer] pilot. Any transcriptional errors are unintentional.   Sheila Sessions, NP 09/28/2021, 1:34 PM

## 2021-12-23 ENCOUNTER — Encounter (HOSPITAL_COMMUNITY): Payer: Self-pay | Admitting: Psychiatry

## 2021-12-23 ENCOUNTER — Telehealth (INDEPENDENT_AMBULATORY_CARE_PROVIDER_SITE_OTHER): Payer: Self-pay | Admitting: Psychiatry

## 2021-12-23 DIAGNOSIS — F32A Depression, unspecified: Secondary | ICD-10-CM

## 2021-12-23 DIAGNOSIS — F411 Generalized anxiety disorder: Secondary | ICD-10-CM

## 2021-12-23 MED ORDER — VENLAFAXINE HCL ER 37.5 MG PO CP24: 37.5000 mg | ORAL_CAPSULE | Freq: Every day | ORAL | 3 refills | Status: DC

## 2021-12-23 MED ORDER — HYDROXYZINE HCL 25 MG PO TABS: 25.0000 mg | ORAL_TABLET | Freq: Three times a day (TID) | ORAL | 3 refills | Status: DC | PRN

## 2021-12-23 NOTE — Progress Notes (Signed)
BH MD/PA/NP OP Progress Note Virtual Visit via Telephone Note  I connected with Sheila Zhang on 12/23/21 at  2:00 PM EDT by telephone and verified that I am speaking with the correct person using two identifiers.  Location: Patient: Work Provider: Clinic   I discussed the limitations, risks, security and privacy concerns of performing an evaluation and management service by telephone and the availability of in person appointments. I also discussed with the patient that there may be a patient responsible charge related to this service. The patient expressed understanding and agreed to proceed.   I provided 30 minutes of non-face-to-face time during this encounter.  12/23/2021 11:13 AM Sheila Zhang  MRN:  [id number]  Chief Complaint: "I am okay I need something for sleep"  HPI: 51 year old female seen today for follow-up psychiatric evaluation.  She has a psychiatric history of anxiety.  She is currently being managed on Effexor 37.5 mg.  She notes her medications are somewhat effective in managing her psychiatric condition.  Today patient was unable to logon virtually so assessment done over the phone.  During exam she was pleasant, cooperative, and engaged in conversation.  She informed Clinical research associate that she is doing okay however notes that she needs something for sleep.  She informed Clinical research associate that she becomes anxious later on in the afternoon and have difficulties winding down.  Provider conducted a GAD-7 and patient scored an 18, at her last visit she scored a 17.  She could not identify a specific trigger at this time however notes that she has always been restless and on edge.  Today provider also conducted PHQ-9 and patient scored a 14, at her last visit she scored an 8.  She endorses fair sleep and poor appetite.  Today she denies SI/HI/AVH, mania, paranoia.    Patient notes that she has been taking her Effexor in the daytime which is more effective in managing her anxiety and  depression.  Today she is agreeable to starting hydroxyzine 25 to 50 mg nightly as needed to help manage sleep and anxiety.  Provider informed patient that she could also take hydroxyzine at bedtime to help manage anxiety. Potential side effects of medication and risks vs benefits of treatment vs non-treatment were explained and discussed. All questions were answered. She endorsed understanding and agreed.  No other concerns noted at this time.   Visit Diagnosis:    ICD-10-CM   1. Mild depression  F32.A venlafaxine XR (EFFEXOR XR) 37.5 MG 24 hr capsule    2. Generalized anxiety disorder  F41.1 venlafaxine XR (EFFEXOR XR) 37.5 MG 24 hr capsule    hydrOXYzine (ATARAX) 25 MG tablet      Past Psychiatric History: Anxiety  Past Medical History:  Past Medical History:  Diagnosis Date   Anxiety    History reviewed. No pertinent surgical history.  Family Psychiatric History: Mother: anxiety, suicidal ideations, depression. Sister: anxiety.  Family History: History reviewed. No pertinent family history.  Social History:  Social History   Socioeconomic History   Marital status: Single    Spouse name: Not on file   Number of children: Not on file   Years of education: Not on file   Highest education level: Not on file  Occupational History   Not on file  Tobacco Use   Smoking status: Never   Smokeless tobacco: Never  Substance and Sexual Activity   Alcohol use: Never   Drug use: Never   Sexual activity: Not on file  Other Topics Concern  Not on file  Social History Narrative   Not on file   Social Determinants of Health   Financial Resource Strain: Not on file  Food Insecurity: Not on file  Transportation Needs: Not on file  Physical Activity: Not on file  Stress: Not on file  Social Connections: Not on file    Allergies:  Allergies  Allergen Reactions   Escitalopram     Burning sensation     Metabolic Disorder Labs: Lab Results  Component Value Date   HGBA1C  5.7 01/05/2021   No results found for: "PROLACTIN" Lab Results  Component Value Date   CHOL 241 (H) 01/05/2021   TRIG 264 (H) 01/05/2021   HDL 57 01/05/2021   CHOLHDL 4.2 01/05/2021   LDLCALC 137 (H) 01/05/2021   Lab Results  Component Value Date   TSH 1.030 01/05/2021    Therapeutic Level Labs: No results found for: "LITHIUM" No results found for: "VALPROATE" No results found for: "CBMZ"  Current Medications: Current Outpatient Medications  Medication Sig Dispense Refill   hydrOXYzine (ATARAX) 25 MG tablet Take 1 tablet (25 mg total) by mouth 3 (three) times daily as needed. 60 tablet 3   atorvastatin (LIPITOR) 80 MG tablet Take 1 tablet (80 mg total) by mouth daily. 90 tablet 3   cetirizine (ZYRTEC ALLERGY) 10 MG tablet Take 1 tablet (10 mg total) by mouth at bedtime. 30 tablet 2   fluticasone (FLONASE) 50 MCG/ACT nasal spray Place 1 spray into both nostrils daily. Begin by using 2 sprays in each nare daily for 3 to 5 days, then decrease to 1 spray in each nare daily. 32 mL 1   meloxicam (MOBIC) 7.5 MG tablet TAKE 1 TABLET BY MOUTH EVERY DAY 30 tablet 0   tiZANidine (ZANAFLEX) 4 MG tablet TAKE 1 TABLET BY MOUTH EVERYDAY AT BEDTIME 30 tablet 0   venlafaxine XR (EFFEXOR XR) 37.5 MG 24 hr capsule Take 1 capsule (37.5 mg total) by mouth daily. 30 capsule 3   No current facility-administered medications for this visit.     Musculoskeletal: Strength & Muscle Tone:  Unable to assess due to telehealth visit Gait & Station:  Unable to assess due to telehealth visit Patient leans: N/A  Psychiatric Specialty Exam: Review of Systems  There were no vitals taken for this visit.There is no height or weight on file to calculate BMI.  General Appearance:  Unable to assess due to telehealth visit  Eye Contact:   Unable to assess due to telehealth visit  Speech:  Clear and Coherent and Normal Rate  Volume:  Normal  Mood:  Anxious and reports anxiety is situational and notes that she is  able to cope with it  Affect:  Appropriate and Congruent  Thought Process:  Coherent, Goal Directed, and Linear  Orientation:  Full (Time, Place, and Person)  Thought Content: WDL and Logical   Suicidal Thoughts:  No  Homicidal Thoughts:  No  Memory:  Immediate;   Good Recent;   Good Remote;   Good  Judgement:  Good  Insight:  Good  Psychomotor Activity:   Unable to assess due to telehealth visit  Concentration:  Concentration: Good and Attention Span: Good  Recall:  Good  Fund of Knowledge: Good  Language: Good  Akathisia:   Unable to assess due to telehealth visit  Handed:  Right  AIMS (if indicated): not done  Assets:  Communication Skills Desire for Improvement Financial Resources/Insurance Housing Intimacy Physical Health Social Support Transportation Vocational/Educational  ADL's:  Intact  Cognition: WNL  Sleep:  Good   Screenings: GAD-7    Flowsheet Row Video Visit from 12/23/2021 in [facility] Office Visit from 09/28/2021 in [facility] Video Visit from 09/22/2021 in [facility] Counselor from 07/06/2021 in [facility] Office Visit from 05/14/2021 in [facility]  Total GAD-7 Score 18 15 17  0 14      PHQ2-9    Flowsheet Row Video Visit from 12/23/2021 in [facility] Office Visit from 09/28/2021 in [facility] Video Visit from 09/22/2021 in [facility] Office Visit from 05/14/2021 in [facility] Office Visit from 02/10/2021 in [facility]  PHQ-2 Total Score 3 2 2 2 2   PHQ-9 Total Score 14 6 8 9 6       Flowsheet Row Video Visit from 09/22/2021 in [facility] ED from 09/06/2021 in [facility]  Office Visit from 05/14/2021 in [facility]  C-SSRS RISK CATEGORY No Risk No Risk No Risk        Assessment and Plan: Patient notes that she is doing well on her current medication regimen.No medication changes made today.  Patient agreeable to taking medications as prescribed.    1. Generalized anxiety disorder  Continue- venlafaxine XR (EFFEXOR XR) 37.5 MG 24 hr capsule; Take 1 capsule (37.5 mg total) by mouth daily.  Dispense: 30 capsule; Refill: 3  Collaboration of Care: Collaboration of Care: Other provider involved in patient's care AEB counselor  Patient/Guardian was advised Release of Information must be obtained prior to any record release in order to collaborate their care with an outside provider. Patient/Guardian was advised if they have not already done so to contact the registration department to sign all necessary forms in order for [facility] to release information regarding their care.   Consent: Patient/Guardian gives verbal consent for treatment and assignment of benefits for services provided during this visit. Patient/Guardian expressed understanding and agreed to proceed.   Follow-up in 3 months Follow-up with therapy  07/12/2021, NP 12/23/2021, 11:13 AM

## 2021-12-31 ENCOUNTER — Other Ambulatory Visit (INDEPENDENT_AMBULATORY_CARE_PROVIDER_SITE_OTHER): Payer: Self-pay | Admitting: Primary Care

## 2021-12-31 DIAGNOSIS — E782 Mixed hyperlipidemia: Secondary | ICD-10-CM

## 2021-12-31 NOTE — Telephone Encounter (Signed)
.   Requested Prescriptions  Pending Prescriptions Disp Refills  . atorvastatin (LIPITOR) 80 MG tablet [Pharmacy Med Name: ATORVASTATIN 80 MG TABLET] 90 tablet 0    Sig: TAKE 1 TABLET BY MOUTH EVERY DAY     Cardiovascular:  Antilipid - Statins Failed - 12/31/2021  2:22 AM      Failed - Lipid Panel in normal range within the last 12 months    Cholesterol, Total  Date Value Ref Range Status  01/05/2021 241 (H) 100 - 199 mg/dL Final   LDL Chol Calc (NIH)  Date Value Ref Range Status  01/05/2021 137 (H) 0 - 99 mg/dL Final   HDL  Date Value Ref Range Status  01/05/2021 57 >39 mg/dL Final   Triglycerides  Date Value Ref Range Status  01/05/2021 264 (H) 0 - 149 mg/dL Final         Passed - Patient is not pregnant      Passed - Valid encounter within last 12 months    Recent Outpatient Visits          3 months ago Hospital discharge follow-up   [place], [place], NP   10 months ago Need for Tdap vaccination   [place], Michelle P, NP   12 months ago Hospital discharge follow-up   [place], [place], NP      Future Appointments            In 4 days Oletta Lamas, Milford Cage, NP [place]   In 2 months Oletta Lamas Milford Cage, NP [place]

## 2022-01-04 ENCOUNTER — Ambulatory Visit (INDEPENDENT_AMBULATORY_CARE_PROVIDER_SITE_OTHER): Payer: Self-pay | Admitting: Primary Care

## 2022-01-21 ENCOUNTER — Ambulatory Visit (INDEPENDENT_AMBULATORY_CARE_PROVIDER_SITE_OTHER): Payer: Self-pay | Admitting: Primary Care

## 2022-02-18 ENCOUNTER — Ambulatory Visit (INDEPENDENT_AMBULATORY_CARE_PROVIDER_SITE_OTHER): Payer: Self-pay | Admitting: Primary Care

## 2022-03-01 ENCOUNTER — Ambulatory Visit (INDEPENDENT_AMBULATORY_CARE_PROVIDER_SITE_OTHER): Payer: 59 | Admitting: Primary Care

## 2022-03-26 ENCOUNTER — Encounter (HOSPITAL_COMMUNITY): Payer: Self-pay | Admitting: Psychiatry

## 2022-03-26 ENCOUNTER — Telehealth (INDEPENDENT_AMBULATORY_CARE_PROVIDER_SITE_OTHER): Payer: No Payment, Other | Admitting: Psychiatry

## 2022-03-26 DIAGNOSIS — F32A Depression, unspecified: Secondary | ICD-10-CM

## 2022-03-26 DIAGNOSIS — F411 Generalized anxiety disorder: Secondary | ICD-10-CM | POA: Diagnosis not present

## 2022-03-26 MED ORDER — HYDROXYZINE HCL 50 MG PO TABS: 50.0000 mg | ORAL_TABLET | Freq: Every evening | ORAL | 3 refills | Status: DC

## 2022-03-26 MED ORDER — VENLAFAXINE HCL ER 37.5 MG PO CP24: 37.5000 mg | ORAL_CAPSULE | Freq: Every day | ORAL | 3 refills | Status: DC

## 2022-03-26 NOTE — Progress Notes (Signed)
BH MD/PA/NP OP Progress Note Virtual Visit via Telephone Note  I connected with Sheila Zhang on 03/26/22 at 10:30 AM EST by telephone and verified that I am speaking with the correct person using two identifiers.  Location: Patient: Work Provider: Clinic   I discussed the limitations, risks, security and privacy concerns of performing an evaluation and management service by telephone and the availability of in person appointments. I also discussed with the patient that there may be a patient responsible charge related to this service. The patient expressed understanding and agreed to proceed.   I provided 30 minutes of non-face-to-face time during this encounter.  03/26/2022 10:31 AM Aneesha Holloran  MRN:  [id number]  Chief Complaint: "I am sleeping well but I have some anxiety"  HPI: 51 year old female seen today for follow-up psychiatric evaluation.  She has a psychiatric history of anxiety.  She is currently being managed on Effexor 37.5 mg and hydroxyzine 50 mg nightly.  She notes her medications are effective in managing her psychiatric condition.  Today patient was unable to logon virtually so assessment done over the phone.  During exam she was pleasant, cooperative, and engaged in conversation.  She informed Clinical research associate that she has been sleeping better since starting hydroxyzine.  She notes that she still has mild anxiety at night.  She notes that at night she fears that something will happen.  Patient reports that she feels heart palpitations at night.  She denies having these palpitations in the daytime but notes that they may be there and I noticed that she is working.  Patient informed Clinical research associate that she has been worried about finances, her mother who recently had a stroke, and her children.  Provider conducted GAD-7 and patient scored a 13, at her last visit she scored an 18.  Provider also conducted PHQ-9 patient scored a 3, at her last visit she scored 14.  She endorses  fluctuations in appetite and a weight gain of over 10 pounds.  Today she denies SI/HI/AVH, mania, paranoia.    Patient informed writer that at times she has restless legs.  She notes that she plans to follow-up with her PCP regarding this.  Provider also encouraged patient to talk to her PCP about her heart palpitations.  No medication changes made today.  Patient agreeable to continue medication as prescribed.  No other concerns noted at this time.  Visit Diagnosis:    ICD-10-CM   1. Generalized anxiety disorder  F41.1 venlafaxine XR (EFFEXOR XR) 37.5 MG 24 hr capsule    hydrOXYzine (ATARAX) 50 MG tablet    2. Mild depression  F32.A venlafaxine XR (EFFEXOR XR) 37.5 MG 24 hr capsule      Past Psychiatric History: Anxiety  Past Medical History:  Past Medical History:  Diagnosis Date   Anxiety    History reviewed. No pertinent surgical history.  Family Psychiatric History: Mother: anxiety, suicidal ideations, depression. Sister: anxiety.  Family History: History reviewed. No pertinent family history.  Social History:  Social History   Socioeconomic History   Marital status: Single    Spouse name: Not on file   Number of children: Not on file   Years of education: Not on file   Highest education level: Not on file  Occupational History   Not on file  Tobacco Use   Smoking status: Never   Smokeless tobacco: Never  Substance and Sexual Activity   Alcohol use: Never   Drug use: Never   Sexual activity: Not on file  Other  Topics Concern   Not on file  Social History Narrative   Not on file   Social Determinants of Health   Financial Resource Strain: Not on file  Food Insecurity: Not on file  Transportation Needs: Not on file  Physical Activity: Not on file  Stress: Not on file  Social Connections: Not on file    Allergies:  Allergies  Allergen Reactions   Escitalopram     Burning sensation     Metabolic Disorder Labs: Lab Results  Component Value Date    HGBA1C 5.7 01/05/2021   No results found for: "PROLACTIN" Lab Results  Component Value Date   CHOL 241 (H) 01/05/2021   TRIG 264 (H) 01/05/2021   HDL 57 01/05/2021   CHOLHDL 4.2 01/05/2021   LDLCALC 137 (H) 01/05/2021   Lab Results  Component Value Date   TSH 1.030 01/05/2021    Therapeutic Level Labs: No results found for: "LITHIUM" No results found for: "VALPROATE" No results found for: "CBMZ"  Current Medications: Current Outpatient Medications  Medication Sig Dispense Refill   atorvastatin (LIPITOR) 80 MG tablet TAKE 1 TABLET BY MOUTH EVERY DAY 90 tablet 0   cetirizine (ZYRTEC ALLERGY) 10 MG tablet Take 1 tablet (10 mg total) by mouth at bedtime. 30 tablet 2   fluticasone (FLONASE) 50 MCG/ACT nasal spray Place 1 spray into both nostrils daily. Begin by using 2 sprays in each nare daily for 3 to 5 days, then decrease to 1 spray in each nare daily. 32 mL 1   hydrOXYzine (ATARAX) 50 MG tablet Take 1 tablet (50 mg total) by mouth at bedtime. 60 tablet 3   meloxicam (MOBIC) 7.5 MG tablet TAKE 1 TABLET BY MOUTH EVERY DAY 30 tablet 0   tiZANidine (ZANAFLEX) 4 MG tablet TAKE 1 TABLET BY MOUTH EVERYDAY AT BEDTIME 30 tablet 0   venlafaxine XR (EFFEXOR XR) 37.5 MG 24 hr capsule Take 1 capsule (37.5 mg total) by mouth daily. 30 capsule 3   No current facility-administered medications for this visit.     Musculoskeletal: Strength & Muscle Tone:  Unable to assess due to telephone visit Gait & Station:  Unable to assess due to telephone visit Patient leans: N/A  Psychiatric Specialty Exam: Review of Systems  There were no vitals taken for this visit.There is no height or weight on file to calculate BMI.  General Appearance:  Unable to assess due to telephone visit  Eye Contact:   Unable to assess due to telephonevisit  Speech:  Clear and Coherent and Normal Rate  Volume:  Normal  Mood:  Euthymic  Affect:  Appropriate and Congruent  Thought Process:  Coherent, Goal Directed, and  Linear  Orientation:  Full (Time, Place, and Person)  Thought Content: WDL and Logical   Suicidal Thoughts:  No  Homicidal Thoughts:  No  Memory:  Immediate;   Good Recent;   Good Remote;   Good  Judgement:  Good  Insight:  Good  Psychomotor Activity:   Unable to assess due to telephone visit  Concentration:  Concentration: Good and Attention Span: Good  Recall:  Good  Fund of Knowledge: Good  Language: Good  Akathisia:   Unable to assess due to telephone visit  Handed:  Right  AIMS (if indicated): not done  Assets:  Communication Skills Desire for Improvement Financial Resources/Insurance Housing Intimacy Physical Health Social Support Transportation Vocational/Educational  ADL's:  Intact  Cognition: WNL  Sleep:  Good   Screenings: GAD-7    Flowsheet Row  Video Visit from 03/26/2022 in [facility] Video Visit from 12/23/2021 in [facility] Office Visit from 09/28/2021 in [facility] Video Visit from 09/22/2021 in [facility] Counselor from 07/06/2021 in [facility]  Total GAD-7 Score 13 18 15 17  0      PHQ2-9    Flowsheet Row Video Visit from 03/26/2022 in [facility] Video Visit from 12/23/2021 in [facility] Office Visit from 09/28/2021 in [facility] Video Visit from 09/22/2021 in [facility] Office Visit from 05/14/2021 in [facility]  PHQ-2 Total Score 2 3 2 2 2   PHQ-9 Total Score 3 14 6 8 9       Flowsheet Row Video Visit from 09/22/2021 in [facility] ED from 09/06/2021 in [facility] at [facility]  Office Visit from 05/14/2021 in [facility]  C-SSRS RISK CATEGORY No Risk No Risk No Risk        Assessment and Plan: Patient  notes that she is doing well on her current medication regimen.No medication changes made today.  Patient agreeable to taking medications as prescribed.    1. Generalized anxiety disorder  Continue- venlafaxine XR (EFFEXOR XR) 37.5 MG 24 hr capsule; Take 1 capsule (37.5 mg total) by mouth daily.  Dispense: 30 capsule; Refill: 3 Continue- hydrOXYzine (ATARAX) 50 MG tablet; Take 1 tablet (50 mg total) by mouth at bedtime.  Dispense: 60 tablet; Refill: 3  2. Mild depression  Continue- venlafaxine XR (EFFEXOR XR) 37.5 MG 24 hr capsule; Take 1 capsule (37.5 mg total) by mouth daily.  Dispense: 30 capsule; Refill: 3  Follow-up in 3 months Follow-up with therapy  [facility], NP 03/26/2022, 10:31 AM

## 2022-04-01 ENCOUNTER — Other Ambulatory Visit (INDEPENDENT_AMBULATORY_CARE_PROVIDER_SITE_OTHER): Payer: Self-pay | Admitting: Primary Care

## 2022-04-01 DIAGNOSIS — E782 Mixed hyperlipidemia: Secondary | ICD-10-CM

## 2022-04-01 NOTE — Telephone Encounter (Signed)
Requested medications are due for refill today.  yes  Requested medications are on the active medications list.  yes  Last refill. 12/31/2021 #90 0 rf  Future visit scheduled.   no  Notes to clinic.  Labs are expired.    Requested Prescriptions  Pending Prescriptions Disp Refills   atorvastatin (LIPITOR) 80 MG tablet [Pharmacy Med Name: ATORVASTATIN 80 MG TABLET] 90 tablet 0    Sig: TAKE 1 TABLET BY MOUTH EVERY DAY     Cardiovascular:  Antilipid - Statins Failed - 04/01/2022  2:15 AM      Failed - Lipid Panel in normal range within the last 12 months    Cholesterol, Total  Date Value Ref Range Status  01/05/2021 241 (H) 100 - 199 mg/dL Final   LDL Chol Calc (NIH)  Date Value Ref Range Status  01/05/2021 137 (H) 0 - 99 mg/dL Final   HDL  Date Value Ref Range Status  01/05/2021 57 >39 mg/dL Final   Triglycerides  Date Value Ref Range Status  01/05/2021 264 (H) 0 - 149 mg/dL Final         Passed - Patient is not pregnant      Passed - Valid encounter within last 12 months    Recent Outpatient Visits           6 months ago Hospital discharge follow-up   [facility] Grayce Sessions, NP   1 year ago Need for Tdap vaccination   [facility] Grayce Sessions, NP   1 year ago Hospital discharge follow-up   [facility] Grayce Sessions, NP

## 2022-04-10 ENCOUNTER — Other Ambulatory Visit (HOSPITAL_COMMUNITY): Payer: Self-pay | Admitting: Psychiatry

## 2022-04-10 DIAGNOSIS — F411 Generalized anxiety disorder: Secondary | ICD-10-CM

## 2022-04-26 ENCOUNTER — Telehealth (INDEPENDENT_AMBULATORY_CARE_PROVIDER_SITE_OTHER): Payer: Self-pay | Admitting: Primary Care

## 2022-04-26 ENCOUNTER — Other Ambulatory Visit (HOSPITAL_COMMUNITY): Payer: Self-pay | Admitting: Psychiatry

## 2022-04-26 DIAGNOSIS — F411 Generalized anxiety disorder: Secondary | ICD-10-CM

## 2022-04-26 NOTE — Telephone Encounter (Signed)
Referral Request - Has patient seen PCP for this complaint? yes *If NO, is insurance requiring patient see PCP for this issue before PCP can refer them? Referral for which specialty: colonoscopy Preferred provider/office: ? Reason for referral: routine colonoscopy  

## 2022-04-29 ENCOUNTER — Other Ambulatory Visit (INDEPENDENT_AMBULATORY_CARE_PROVIDER_SITE_OTHER): Payer: Self-pay

## 2022-04-29 DIAGNOSIS — Z1211 Encounter for screening for malignant neoplasm of colon: Secondary | ICD-10-CM

## 2022-04-29 NOTE — Telephone Encounter (Signed)
Referral has been placed. 

## 2022-05-03 ENCOUNTER — Ambulatory Visit (INDEPENDENT_AMBULATORY_CARE_PROVIDER_SITE_OTHER): Payer: Self-pay | Admitting: Primary Care

## 2022-05-05 ENCOUNTER — Ambulatory Visit
Admission: EM | Admit: 2022-05-05 | Discharge: 2022-05-05 | Disposition: A | Discharge status: Home / Self Care | Payer: Self-pay | Attending: Physician Assistant | Admitting: Physician Assistant

## 2022-05-05 DIAGNOSIS — Z1152 Encounter for screening for COVID-19: Secondary | ICD-10-CM | POA: Insufficient documentation

## 2022-05-05 DIAGNOSIS — J069 Acute upper respiratory infection, unspecified: Secondary | ICD-10-CM | POA: Insufficient documentation

## 2022-05-05 HISTORY — DX: Hyperlipidemia, unspecified: E78.5

## 2022-05-05 LAB — POCT INFLUENZA A/B
Influenza A, POC: NEGATIVE
Influenza B, POC: NEGATIVE

## 2022-05-05 NOTE — ED Provider Notes (Signed)
[facility]    CSN: [id number] Arrival date & time: 05/05/22  1453      History   Chief Complaint Chief Complaint  Patient presents with   Generalized Body Aches    HPI Sheila Zhang is a 52 y.o. female.   Patient here today for evaluation of generalized body aches, headache, congestion and cough that started 2-3 days ago. She has not had fever. She has tried OTC meds without resolution. She denies any nausea, vomiting or diarrhea.   The history is provided by the patient.    Past Medical History:  Diagnosis Date   Anxiety    Dyslipidemia     Patient Active Problem List   Diagnosis Date Noted   Lateral epicondylitis, left elbow 05/12/2021   Generalized anxiety disorder 03/14/2021    History reviewed. No pertinent surgical history.  OB History   No obstetric history on file.      Home Medications    Prior to Admission medications   Medication Sig Start Date End Date Taking? Authorizing Provider  atorvastatin (LIPITOR) 80 MG tablet TAKE 1 TABLET BY MOUTH EVERY DAY 12/31/21   Kerin Perna, NP  cetirizine (ZYRTEC ALLERGY) 10 MG tablet Take 1 tablet (10 mg total) by mouth at bedtime. 09/06/21 12/05/21  Lynden Oxford Scales, PA-C  fluticasone (FLONASE) 50 MCG/ACT nasal spray Place 1 spray into both nostrils daily. Begin by using 2 sprays in each nare daily for 3 to 5 days, then decrease to 1 spray in each nare daily. 09/06/21   Lynden Oxford Scales, PA-C  hydrOXYzine (ATARAX) 50 MG tablet TAKE 1 TABLET BY MOUTH EVERYDAY AT BEDTIME. NEED PRIMARY INSURANCE 04/26/22   Eulis Canner E, NP  venlafaxine XR (EFFEXOR XR) 37.5 MG 24 hr capsule Take 1 capsule (37.5 mg total) by mouth daily. 03/26/22   Salley Slaughter, NP    Family History History reviewed. No pertinent family history.  Social History Social History   Tobacco Use   Smoking status: Never   Smokeless tobacco: Never  Substance Use Topics   Alcohol use: Never   Drug use:  Never     Allergies   Escitalopram   Review of Systems Review of Systems  Constitutional:  Positive for chills. Negative for fever.  HENT:  Positive for congestion and sore throat. Negative for ear pain.   Eyes:  Negative for discharge and redness.  Respiratory:  Positive for cough. Negative for shortness of breath and wheezing.   Gastrointestinal:  Negative for abdominal pain, diarrhea, nausea and vomiting.  Musculoskeletal:  Positive for myalgias.  Neurological:  Positive for headaches.     Physical Exam Triage Vital Signs ED Triage Vitals  Enc Vitals Group     BP      Pulse      Resp      Temp      Temp src      SpO2      Weight      Height      Head Circumference      Peak Flow      Pain Score      Pain Loc      Pain Edu?      Excl. in [place]?    No data found.  Updated Vital Signs BP 106/74 (BP Location: Left Arm)   Pulse 87   Temp 98 F (36.7 C) (Oral)   Resp 16   SpO2 97%      Physical Exam Vitals and  nursing note reviewed.  Constitutional:      General: She is not in acute distress.    Appearance: Normal appearance. She is not ill-appearing.  HENT:     Head: Normocephalic and atraumatic.     Nose: Congestion present.     Mouth/Throat:     Mouth: Mucous membranes are moist.     Pharynx: No oropharyngeal exudate or posterior oropharyngeal erythema.  Eyes:     Conjunctiva/sclera: Conjunctivae normal.  Cardiovascular:     Rate and Rhythm: Normal rate and regular rhythm.     Heart sounds: Normal heart sounds. No murmur heard. Pulmonary:     Effort: Pulmonary effort is normal. No respiratory distress.     Breath sounds: Normal breath sounds. No wheezing, rhonchi or rales.  Skin:    General: Skin is warm and dry.  Neurological:     Mental Status: She is alert.  Psychiatric:        Mood and Affect: Mood normal.        Thought Content: Thought content normal.      UC Treatments / Results  Labs (all labs ordered are listed, but only abnormal  results are displayed) Labs Reviewed  SARS CORONAVIRUS 2 (TAT 6-24 HRS)  POCT INFLUENZA A/B    EKG   Radiology No results found.  Procedures Procedures (including critical care time)  Medications Ordered in UC Medications - No data to display  Initial Impression / Assessment and Plan / UC Course  I have reviewed the triage vital signs and the nursing notes.  Pertinent labs & imaging results that were available during my care of the patient were reviewed by me and considered in my medical decision making (see chart for details).    Flu screening negative. Recommend covid screening which patient is agreeable to. Recommend symptomatic treatment, increased fluids and rest. Encouraged follow up if no gradual improvement or with any worsening symptoms.  Final Clinical Impressions(s) / UC Diagnoses   Final diagnoses:  Acute upper respiratory infection  Encounter for screening for COVID-19   Discharge Instructions   None    ED Prescriptions   None    PDMP not reviewed this encounter.   Francene Finders, PA-C 05/05/22 1556

## 2022-05-05 NOTE — ED Triage Notes (Signed)
Pt c/o cough, body aches and chills, headaches  Onset ~ Monday

## 2022-05-06 LAB — SARS CORONAVIRUS 2 (TAT 6-24 HRS): SARS Coronavirus 2: NEGATIVE

## 2022-05-25 ENCOUNTER — Ambulatory Visit
Admission: RE | Admit: 2022-05-25 | Discharge: 2022-05-25 | Disposition: A | Discharge status: Home / Self Care | Payer: No Typology Code available for payment source | Source: Ambulatory Visit | Attending: Family Medicine | Admitting: Family Medicine

## 2022-05-25 ENCOUNTER — Other Ambulatory Visit: Payer: Self-pay

## 2022-05-25 VITALS — BP 132/83 | HR 82 | Temp 97.9°F | Resp 18

## 2022-05-25 DIAGNOSIS — U071 COVID-19: Secondary | ICD-10-CM | POA: Diagnosis not present

## 2022-05-25 DIAGNOSIS — J069 Acute upper respiratory infection, unspecified: Secondary | ICD-10-CM | POA: Insufficient documentation

## 2022-05-25 DIAGNOSIS — R059 Cough, unspecified: Secondary | ICD-10-CM | POA: Diagnosis not present

## 2022-05-25 MED ORDER — BENZONATATE 100 MG PO CAPS: 100.0000 mg | ORAL_CAPSULE | Freq: Three times a day (TID) | ORAL | 0 refills | Status: DC | PRN

## 2022-05-25 NOTE — ED Provider Notes (Addendum)
[facility]    CSN: [id number] Arrival date & time: 05/25/22  0853      History   Chief Complaint Chief Complaint  Patient presents with   Generalized Body Aches    HPI Sheila Zhang is a 52 y.o. female.   HPI Here for congestion and cough.  Symptoms began on February 10.  She had fever in the 100.4 range the first day or so.  She has not had any fever since February 11.  No vomiting or diarrhea or nausea.  She has had some postnasal drainage and has been hoarse.  She has been aching a lot, though that has improved just a little bit today.  Past Medical History:  Diagnosis Date   Anxiety    Dyslipidemia     Patient Active Problem List   Diagnosis Date Noted   Lateral epicondylitis, left elbow 05/12/2021   Generalized anxiety disorder 03/14/2021    History reviewed. No pertinent surgical history.  OB History   No obstetric history on file.      Home Medications    Prior to Admission medications   Medication Sig Start Date End Date Taking? Authorizing Provider  benzonatate (TESSALON) 100 MG capsule Take 1 capsule (100 mg total) by mouth 3 (three) times daily as needed for cough. 05/25/22  Yes Barrett Henle, MD  atorvastatin (LIPITOR) 80 MG tablet TAKE 1 TABLET BY MOUTH EVERY DAY 12/31/21   Kerin Perna, NP  cetirizine (ZYRTEC ALLERGY) 10 MG tablet Take 1 tablet (10 mg total) by mouth at bedtime. 09/06/21 12/05/21  Lynden Oxford Scales, PA-C  fluticasone (FLONASE) 50 MCG/ACT nasal spray Place 1 spray into both nostrils daily. Begin by using 2 sprays in each nare daily for 3 to 5 days, then decrease to 1 spray in each nare daily. 09/06/21   Lynden Oxford Scales, PA-C  hydrOXYzine (ATARAX) 50 MG tablet TAKE 1 TABLET BY MOUTH EVERYDAY AT BEDTIME. NEED PRIMARY INSURANCE 04/26/22   Eulis Canner E, NP  venlafaxine XR (EFFEXOR XR) 37.5 MG 24 hr capsule Take 1 capsule (37.5 mg total) by mouth daily. 03/26/22   Salley Slaughter, NP     Family History History reviewed. No pertinent family history.  Social History Social History   Tobacco Use   Smoking status: Never   Smokeless tobacco: Never  Substance Use Topics   Alcohol use: Never   Drug use: Never     Allergies   Escitalopram   Review of Systems Review of Systems   Physical Exam Triage Vital Signs ED Triage Vitals  Enc Vitals Group     BP 05/25/22 0914 132/83     Pulse Rate 05/25/22 0914 82     Resp 05/25/22 0914 18     Temp 05/25/22 0914 97.9 F (36.6 C)     Temp Source 05/25/22 0914 Oral     SpO2 05/25/22 0914 95 %     Weight --      Height --      Head Circumference --      Peak Flow --      Pain Score 05/25/22 0915 4     Pain Loc --      Pain Edu? --      Excl. in [place]? --    No data found.  Updated Vital Signs BP 132/83 (BP Location: Left Arm)   Pulse 82   Temp 97.9 F (36.6 C) (Oral)   Resp 18   SpO2 95%   Visual  Acuity Right Eye Distance:   Left Eye Distance:   Bilateral Distance:    Right Eye Near:   Left Eye Near:    Bilateral Near:     Physical Exam Vitals reviewed.  Constitutional:      General: She is not in acute distress.    Appearance: She is not ill-appearing, toxic-appearing or diaphoretic.  HENT:     Right Ear: Tympanic membrane and ear canal normal.     Left Ear: Tympanic membrane and ear canal normal.     Nose: Congestion present.     Mouth/Throat:     Mouth: Mucous membranes are moist.     Comments: Clear mucus is draining Eyes:     Extraocular Movements: Extraocular movements intact.     Conjunctiva/sclera: Conjunctivae normal.     Pupils: Pupils are equal, round, and reactive to light.  Cardiovascular:     Rate and Rhythm: Normal rate and regular rhythm.     Heart sounds: No murmur heard. Pulmonary:     Effort: Pulmonary effort is normal. No respiratory distress.     Breath sounds: No stridor. No wheezing, rhonchi or rales.  Musculoskeletal:     Cervical back: Neck supple.   Lymphadenopathy:     Cervical: No cervical adenopathy.  Skin:    Capillary Refill: Capillary refill takes less than 2 seconds.     Coloration: Skin is not jaundiced or pale.  Neurological:     General: No focal deficit present.     Mental Status: She is alert and oriented to person, place, and time.  Psychiatric:        Behavior: Behavior normal.      UC Treatments / Results  Labs (all labs ordered are listed, but only abnormal results are displayed) Labs Reviewed  SARS CORONAVIRUS 2 (TAT 6-24 HRS)    EKG   Radiology No results found.  Procedures Procedures (including critical care time)  Medications Ordered in UC Medications - No data to display  Initial Impression / Assessment and Plan / UC Course  I have reviewed the triage vital signs and the nursing notes.  Pertinent labs & imaging results that were available during my care of the patient were reviewed by me and considered in my medical decision making (see chart for details).       Since the fever is already gone and it is day 3 I will not do a flu swab.  Tessalon Perles are sent in for the cough, and COVID swab is done today.  If she is positive, she is a candidate for Paxlovid.  Her last EGFR was 100 Final Clinical Impressions(s) / UC Diagnoses   Final diagnoses:  Viral URI with cough     Discharge Instructions      Take benzonatate 100 mg, 1 tab every 8 hours as needed for cough.   You have been swabbed for COVID, and the test will result in the next 24 hours. Our staff will call you if positive. If the COVID test is positive, you should quarantine for 5 days from the start of your symptoms.  On days 6-10 from the start of your illness, you should wear a mask if out in public.       ED Prescriptions     Medication Sig Dispense Auth. Provider   benzonatate (TESSALON) 100 MG capsule Take 1 capsule (100 mg total) by mouth 3 (three) times daily as needed for cough. 21 capsule Barrett Henle, MD  PDMP not reviewed this encounter.   Barrett Henle, MD 05/25/22 [id number]    Barrett Henle, MD 05/25/22 [phone]

## 2022-05-25 NOTE — Discharge Instructions (Addendum)
Take benzonatate 100 mg, 1 tab every 8 hours as needed for cough.   You have been swabbed for COVID, and the test will result in the next 24 hours. Our staff will call you if positive. If the COVID test is positive, you should quarantine for 5 days from the start of your symptoms.  On days 6-10 from the start of your illness, you should wear a mask if out in public.

## 2022-05-25 NOTE — ED Triage Notes (Signed)
Pt here for body aches; pt on phone during triage

## 2022-05-26 ENCOUNTER — Telehealth (HOSPITAL_COMMUNITY): Payer: Self-pay | Admitting: Emergency Medicine

## 2022-05-26 LAB — SARS CORONAVIRUS 2 (TAT 6-24 HRS): SARS Coronavirus 2: POSITIVE — AB

## 2022-05-26 MED ORDER — NIRMATRELVIR/RITONAVIR (PAXLOVID)TABLET: 3.0000 | ORAL_TABLET | Freq: Two times a day (BID) | ORAL | 0 refills | Status: AC

## 2022-06-07 ENCOUNTER — Telehealth: Payer: Self-pay | Admitting: *Deleted

## 2022-06-07 ENCOUNTER — Ambulatory Visit (AMBULATORY_SURGERY_CENTER): Payer: Self-pay | Admitting: *Deleted

## 2022-06-07 VITALS — Ht 64.0 in | Wt 220.0 lb

## 2022-06-07 DIAGNOSIS — Z1211 Encounter for screening for malignant neoplasm of colon: Secondary | ICD-10-CM

## 2022-06-07 MED ORDER — NA SULFATE-K SULFATE-MG SULF 17.5-3.13-1.6 GM/177ML PO SOLN: 1.0000 | Freq: Once | ORAL | 0 refills | Status: AC

## 2022-06-07 NOTE — Progress Notes (Signed)
No egg or soy allergy known to patient  No issues known to pt with past sedation with any surgeries or procedures Patient denies ever being told they had issues or difficulty with intubation   No issues with moving neck or head or swallowing No FH of Malignant Hyperthermia Pt is not on diet pills Pt is not on  home 02  Pt is not on blood thinners  Pt denies issues with constipation  Pt is not on dialysis Pt denies any upcoming cardiac testing Pt encouraged to use to use Singlecare or Goodrx to reduce cost  Patient's chart reviewed by Osvaldo Angst CNRA prior to previsit and patient appropriate for the [place].  Previsit completed and red dot placed by patient's name on their procedure day (on provider's schedule).  . Visit by phone Instructions reviewed with pt and pt states understanding. Instructed to review again prior to procedure. Pt states they will.

## 2022-06-07 NOTE — Telephone Encounter (Signed)
Attempted to reach pt for pre-visit. LM with call back #. Other # listed in profile is same as one called.

## 2022-06-16 ENCOUNTER — Other Ambulatory Visit (HOSPITAL_COMMUNITY): Payer: Self-pay | Admitting: Psychiatry

## 2022-06-16 DIAGNOSIS — F32A Depression, unspecified: Secondary | ICD-10-CM

## 2022-06-16 DIAGNOSIS — F411 Generalized anxiety disorder: Secondary | ICD-10-CM

## 2022-06-23 ENCOUNTER — Encounter (INDEPENDENT_AMBULATORY_CARE_PROVIDER_SITE_OTHER): Payer: Self-pay

## 2022-06-25 ENCOUNTER — Telehealth (INDEPENDENT_AMBULATORY_CARE_PROVIDER_SITE_OTHER): Payer: Medicaid Other | Admitting: Psychiatry

## 2022-06-25 ENCOUNTER — Encounter (HOSPITAL_COMMUNITY): Payer: Self-pay | Admitting: Psychiatry

## 2022-06-25 DIAGNOSIS — F32A Depression, unspecified: Secondary | ICD-10-CM | POA: Diagnosis not present

## 2022-06-25 DIAGNOSIS — F411 Generalized anxiety disorder: Secondary | ICD-10-CM | POA: Diagnosis not present

## 2022-06-25 MED ORDER — VENLAFAXINE HCL ER 37.5 MG PO CP24: 37.5000 mg | ORAL_CAPSULE | Freq: Every day | ORAL | 3 refills | Status: DC

## 2022-06-25 MED ORDER — HYDROXYZINE HCL 50 MG PO TABS: ORAL_TABLET | ORAL | 3 refills | Status: DC

## 2022-06-25 NOTE — Progress Notes (Signed)
[facility] MD/PA/NP OP Progress Note Virtual Visit via Video Note  I connected with Sheila Zhang on 06/25/22 at  9:30 AM EDT by a video enabled telemedicine application and verified that I am speaking with the correct person using two identifiers.  Location: Patient: Home Provider: Clinic   I discussed the limitations of evaluation and management by telemedicine and the availability of in person appointments. The patient expressed understanding and agreed to proceed.  I provided 30 minutes of non-face-to-face time during this encounter.    06/25/2022 9:38 AM Sheila Zhang  MRN:  [id number]  Chief Complaint: "I have been a little anxious"  HPI: 52 year old female seen today for follow-up psychiatric evaluation.  She has a psychiatric history of anxiety.  She is currently being managed on Effexor 37.5 mg and hydroxyzine 50 mg nightly.  She notes her medications are effective in managing her psychiatric condition.  Today patient was pleasant, cooperative, and engaged in conversation.  She informed Probation officer that she was a little anxious.  She notes that recently her mother had a stroke.  She informed Probation officer that she relocated her mother from [place] and she is now caring for her in her home.  Patient informed Probation officer that she is adjusting to this new normal.  She also notes at times work can be stressful.  Today provider conducted GAD-7 and patient scored a 19, at her last visit she scored a 13.  Provider also conducted a PHQ-9 and patient scored a 10, at her last visit she scored a 3.  She endorses adequate sleep and appetite.  Today she denies SI/HI/AVH, mania, paranoia.    Patient reports that the above stressors are situational.  At this time she requested no medication changes.  Patient agreeable to continue medication as prescribed.  No other concerns noted at this time.  Visit Diagnosis:    ICD-10-CM   1. Generalized anxiety disorder  F41.1 venlafaxine XR (EFFEXOR-XR) 37.5 MG 24 hr  capsule    hydrOXYzine (ATARAX) 50 MG tablet    2. Mild depression  F32.A venlafaxine XR (EFFEXOR-XR) 37.5 MG 24 hr capsule      Past Psychiatric History: Anxiety  Past Medical History:  Past Medical History:  Diagnosis Date   Anxiety    Dyslipidemia     Past Surgical History:  Procedure Laterality Date   tubaligation      Family Psychiatric History: Mother: anxiety, suicidal ideations, depression. Sister: anxiety.  Family History:  Family History  Problem Relation Age of Onset   Colon cancer Neg Hx    Colon polyps Neg Hx    Esophageal cancer Neg Hx    Stomach cancer Neg Hx    Rectal cancer Neg Hx     Social History:  Social History   Socioeconomic History   Marital status: Single    Spouse name: Not on file   Number of children: Not on file   Years of education: Not on file   Highest education level: Not on file  Occupational History   Not on file  Tobacco Use   Smoking status: Never   Smokeless tobacco: Never  Substance and Sexual Activity   Alcohol use: Never   Drug use: Never   Sexual activity: Not on file  Other Topics Concern   Not on file  Social History Narrative   Not on file   Social Determinants of Health   Financial Resource Strain: Not on file  Food Insecurity: Not on file  Transportation Needs: Not on file  Physical Activity: Not on file  Stress: Not on file  Social Connections: Not on file    Allergies:  Allergies  Allergen Reactions   Escitalopram     Burning sensation     Metabolic Disorder Labs: Lab Results  Component Value Date   HGBA1C 5.7 01/05/2021   No results found for: "PROLACTIN" Lab Results  Component Value Date   CHOL 241 (H) 01/05/2021   TRIG 264 (H) 01/05/2021   HDL 57 01/05/2021   CHOLHDL 4.2 01/05/2021   LDLCALC 137 (H) 01/05/2021   Lab Results  Component Value Date   TSH 1.030 01/05/2021    Therapeutic Level Labs: No results found for: "LITHIUM" No results found for: "VALPROATE" No results  found for: "CBMZ"  Current Medications: Current Outpatient Medications  Medication Sig Dispense Refill   atorvastatin (LIPITOR) 80 MG tablet TAKE 1 TABLET BY MOUTH EVERY DAY 90 tablet 0   benzonatate (TESSALON) 100 MG capsule Take 1 capsule (100 mg total) by mouth 3 (three) times daily as needed for cough. 21 capsule 0   cetirizine (ZYRTEC ALLERGY) 10 MG tablet Take 1 tablet (10 mg total) by mouth at bedtime. 30 tablet 2   fluticasone (FLONASE) 50 MCG/ACT nasal spray Place 1 spray into both nostrils daily. Begin by using 2 sprays in each nare daily for 3 to 5 days, then decrease to 1 spray in each nare daily. 32 mL 1   hydrOXYzine (ATARAX) 50 MG tablet TAKE 1 TABLET BY MOUTH EVERYDAY AT BEDTIME. NEED PRIMARY INSURANCE 90 tablet 3   venlafaxine XR (EFFEXOR-XR) 37.5 MG 24 hr capsule Take 1 capsule (37.5 mg total) by mouth daily. 90 capsule 3   No current facility-administered medications for this visit.     Musculoskeletal: Strength & Muscle Tone: within normal limits Gait & Station: normal, Telehealth Patient leans: N/A  Psychiatric Specialty Exam: Review of Systems  There were no vitals taken for this visit.There is no height or weight on file to calculate BMI.  General Appearance: Well Groomed  Eye Contact:  Good  Speech:  Clear and Coherent and Normal Rate  Volume:  Normal  Mood:  Euthymic  Affect:  Appropriate and Congruent  Thought Process:  Coherent, Goal Directed, and Linear  Orientation:  Full (Time, Place, and Person)  Thought Content: WDL and Logical   Suicidal Thoughts:  No  Homicidal Thoughts:  No  Memory:  Immediate;   Good Recent;   Good Remote;   Good  Judgement:  Good  Insight:  Good  Psychomotor Activity:  Normal  Concentration:  Concentration: Good and Attention Span: Good  Recall:  Good  Fund of Knowledge: Good  Language: Good  Akathisia:   Unable to assess due to telephone visit  Handed:  Right  AIMS (if indicated): not done  Assets:  Communication  Skills Desire for Improvement Financial Resources/Insurance Housing Intimacy Physical Health Social Support Transportation Vocational/Educational  ADL's:  Intact  Cognition: WNL  Sleep:  Good   Screenings: GAD-7    Flowsheet Row Video Visit from 06/25/2022 in [facility] Video Visit from 03/26/2022 in [facility] Video Visit from 12/23/2021 in [facility] Office Visit from 09/28/2021 in [place] Video Visit from 09/22/2021 in [facility]  Total GAD-7 Score 19 13 18 15 17       PHQ2-9    Flowsheet Row Video Visit from 06/25/2022 in [facility] Video Visit  from 03/26/2022 in [facility] Video Visit from 12/23/2021 in [facility] Office Visit from 09/28/2021 in [place] Video Visit from 09/22/2021 in [facility]  PHQ-2 Total Score 3 2 3 2 2   PHQ-9 Total Score 10 3 14 6 8       Flowsheet Row ED from 05/25/2022 in [facility]) ED from 05/05/2022 in [facility] at [facility] Video Visit from 09/22/2021 in [place] No Risk No Risk No Risk        Assessment and Plan: Patient notes that she has increased anxiety and depression due to life stressors.  She however reports she is able to cope with it. No medication changes made today.  Patient agreeable to taking medications as prescribed.    1. Generalized anxiety disorder  Continue- venlafaxine XR (EFFEXOR XR) 37.5 MG 24 hr capsule; Take 1 capsule (37.5 mg total) by mouth daily.  Dispense: 30 capsule; Refill: 3 Continue- hydrOXYzine (ATARAX) 50 MG tablet; Take 1 tablet (50 mg total) by mouth at bedtime.  Dispense: 60 tablet; Refill:  3  2. Mild depression  Continue- venlafaxine XR (EFFEXOR XR) 37.5 MG 24 hr capsule; Take 1 capsule (37.5 mg total) by mouth daily.  Dispense: 30 capsule; Refill: 3  Follow-up in 3 months Follow-up with therapy  Salley Slaughter, NP 06/25/2022, 9:38 AM

## 2022-06-28 ENCOUNTER — Encounter: Payer: Medicaid Other | Admitting: Gastroenterology

## 2022-06-28 ENCOUNTER — Telehealth: Payer: Self-pay | Admitting: Gastroenterology

## 2022-06-28 NOTE — Telephone Encounter (Signed)
Good Morning Dr. Candis Schatz,  I called this patient at 10:45 she stated she total forgot about procedure.   She stated she wanted to the Home Cologaurd test instead.  I will NO SHOW patient  Medicaid

## 2022-07-06 ENCOUNTER — Ambulatory Visit
Admission: EM | Admit: 2022-07-06 | Discharge: 2022-07-06 | Disposition: A | Discharge status: Home / Self Care | Payer: Medicaid Other | Attending: Internal Medicine | Admitting: Internal Medicine

## 2022-07-06 DIAGNOSIS — J069 Acute upper respiratory infection, unspecified: Secondary | ICD-10-CM

## 2022-07-06 DIAGNOSIS — J029 Acute pharyngitis, unspecified: Secondary | ICD-10-CM

## 2022-07-06 LAB — POCT RAPID STREP A (OFFICE): Rapid Strep A Screen: NEGATIVE

## 2022-07-06 MED ORDER — KETOROLAC TROMETHAMINE 30 MG/ML IJ SOLN
30.0000 mg | Freq: Once | INTRAMUSCULAR | Status: AC
  Administered 2022-07-06: 30 mg via INTRAMUSCULAR

## 2022-07-06 NOTE — ED Provider Notes (Signed)
[facility]    CSN: [id number] Arrival date & time: 07/06/22  1657      History   Chief Complaint Chief Complaint  Patient presents with   Sore Throat   Headache    HPI Sheila Zhang is a 52 y.o. female.   Patient presents with sore throat, congestion, headache that started this morning upon waking.  Reports coworker has similar symptoms.  Patient denies any fever or cough.  Denies chest pain, shortness of breath, gastrointestinal symptoms.  She has not had any medications for symptoms.  Denies history of asthma or COPD and patient does not smoke cigarettes.   Sore Throat  Headache   Past Medical History:  Diagnosis Date   Anxiety    Dyslipidemia     Patient Active Problem List   Diagnosis Date Noted   Lateral epicondylitis, left elbow 05/12/2021   Generalized anxiety disorder 03/14/2021    Past Surgical History:  Procedure Laterality Date   tubaligation      OB History   No obstetric history on file.      Home Medications    Prior to Admission medications   Medication Sig Start Date End Date Taking? Authorizing Provider  atorvastatin (LIPITOR) 80 MG tablet TAKE 1 TABLET BY MOUTH EVERY DAY 12/31/21   Kerin Perna, NP  benzonatate (TESSALON) 100 MG capsule Take 1 capsule (100 mg total) by mouth 3 (three) times daily as needed for cough. 05/25/22   Barrett Henle, MD  cetirizine (ZYRTEC ALLERGY) 10 MG tablet Take 1 tablet (10 mg total) by mouth at bedtime. 09/06/21 12/05/21  Lynden Oxford Scales, PA-C  fluticasone (FLONASE) 50 MCG/ACT nasal spray Place 1 spray into both nostrils daily. Begin by using 2 sprays in each nare daily for 3 to 5 days, then decrease to 1 spray in each nare daily. 09/06/21   Lynden Oxford Scales, PA-C  hydrOXYzine (ATARAX) 50 MG tablet TAKE 1 TABLET BY MOUTH EVERYDAY AT BEDTIME. NEED PRIMARY INSURANCE 06/25/22   Eulis Canner E, NP  venlafaxine XR (EFFEXOR-XR) 37.5 MG 24 hr capsule Take 1 capsule (37.5  mg total) by mouth daily. 06/25/22   Salley Slaughter, NP    Family History Family History  Problem Relation Age of Onset   Colon cancer Neg Hx    Colon polyps Neg Hx    Esophageal cancer Neg Hx    Stomach cancer Neg Hx    Rectal cancer Neg Hx     Social History Social History   Tobacco Use   Smoking status: Never   Smokeless tobacco: Never  Substance Use Topics   Alcohol use: Never   Drug use: Never     Allergies   Escitalopram   Review of Systems Review of Systems Per HPI  Physical Exam Triage Vital Signs ED Triage Vitals  Enc Vitals Group     BP 07/06/22 1713 121/80     Pulse Rate 07/06/22 1713 91     Resp 07/06/22 1713 17     Temp 07/06/22 1713 98.3 F (36.8 C)     Temp Source 07/06/22 1713 Oral     SpO2 07/06/22 1713 96 %     Weight --      Height --      Head Circumference --      Peak Flow --      Pain Score 07/06/22 1714 6     Pain Loc --      Pain Edu? --  Excl. in GC? --    No data found.  Updated Vital Signs BP 121/80 (BP Location: Left Arm)   Pulse 91   Temp 98.3 F (36.8 C) (Oral)   Resp 17   SpO2 96%   Visual Acuity Right Eye Distance:   Left Eye Distance:   Bilateral Distance:    Right Eye Near:   Left Eye Near:    Bilateral Near:     Physical Exam Constitutional:      General: She is not in acute distress.    Appearance: Normal appearance. She is not toxic-appearing or diaphoretic.  HENT:     Head: Normocephalic and atraumatic.     Right Ear: Tympanic membrane and ear canal normal.     Left Ear: Tympanic membrane and ear canal normal.     Nose: Congestion present.     Mouth/Throat:     Mouth: Mucous membranes are moist.     Pharynx: Posterior oropharyngeal erythema present.  Eyes:     Extraocular Movements: Extraocular movements intact.     Conjunctiva/sclera: Conjunctivae normal.     Pupils: Pupils are equal, round, and reactive to light.  Cardiovascular:     Rate and Rhythm: Normal rate and regular  rhythm.     Pulses: Normal pulses.     Heart sounds: Normal heart sounds.  Pulmonary:     Effort: Pulmonary effort is normal. No respiratory distress.     Breath sounds: Normal breath sounds. No wheezing.  Abdominal:     General: Abdomen is flat. Bowel sounds are normal.     Palpations: Abdomen is soft.  Musculoskeletal:        General: Normal range of motion.     Cervical back: Normal range of motion.  Skin:    General: Skin is warm and dry.  Neurological:     General: No focal deficit present.     Mental Status: She is alert and oriented to person, place, and time. Mental status is at baseline.  Psychiatric:        Mood and Affect: Mood normal.        Behavior: Behavior normal.      UC Treatments / Results  Labs (all labs ordered are listed, but only abnormal results are displayed) Labs Reviewed  CULTURE, GROUP A STREP [facility])  POCT RAPID STREP A (OFFICE)    EKG   Radiology No results found.  Procedures Procedures (including critical care time)  Medications Ordered in UC Medications  ketorolac (TORADOL) 30 MG/ML injection 30 mg (30 mg Intramuscular Given 07/06/22 1801)    Initial Impression / Assessment and Plan / UC Course  I have reviewed the triage vital signs and the nursing notes.  Pertinent labs & imaging results that were available during my care of the patient were reviewed by me and considered in my medical decision making (see chart for details).     Patient presents with symptoms likely from a viral upper respiratory infection. Do not suspect underlying cardiopulmonary process. Symptoms seem unlikely related to ACS, CHF or COPD exacerbations, pneumonia, pneumothorax. Patient is nontoxic appearing and not in need of emergent medical intervention.  Rapid strep is negative.  Throat culture pending.  Doubt COVID given patient had it 1 month ago.  Recommended symptom control with over the counter medications.  IM Toradol administered in urgent care today  for headache, sore throat, inflammation.  Advised patient no NSAIDs for at least 24 hours following injection.  Return if symptoms fail to improve  in 1-2 weeks or you develop shortness of breath, chest pain, severe headache. Patient states understanding and is agreeable.  Discharged with PCP followup.  Final Clinical Impressions(s) / UC Diagnoses   Final diagnoses:  Viral upper respiratory infection  Sore throat     Discharge Instructions      Strep was negative.  Throat culture is pending.  Will call if it is abnormal.  As we discussed, it appears that you have a viral illness that should run its course and self resolve with the help of symptomatic treatment.  Toradol shot was given today.  Please do not take any ibuprofen, Advil, Aleve for at least 24 hours following injection.  Follow-up if any symptoms persist or worsen.     ED Prescriptions   None    PDMP not reviewed this encounter.   Teodora Medici, [place] 07/06/22 [phone]

## 2022-07-06 NOTE — ED Triage Notes (Signed)
Pt presents with sore throat ans sinus headache since waking up this morning.

## 2022-07-06 NOTE — Discharge Instructions (Signed)
Strep was negative.  Throat culture is pending.  Will call if it is abnormal.  As we discussed, it appears that you have a viral illness that should run its course and self resolve with the help of symptomatic treatment.  Toradol shot was given today.  Please do not take any ibuprofen, Advil, Aleve for at least 24 hours following injection.  Follow-up if any symptoms persist or worsen.

## 2022-07-07 ENCOUNTER — Ambulatory Visit
Admission: EM | Admit: 2022-07-07 | Discharge: 2022-07-07 | Disposition: A | Discharge status: Home / Self Care | Payer: Medicaid Other | Attending: Internal Medicine | Admitting: Internal Medicine

## 2022-07-07 ENCOUNTER — Telehealth (HOSPITAL_COMMUNITY): Payer: Self-pay | Admitting: Psychiatry

## 2022-07-07 DIAGNOSIS — J069 Acute upper respiratory infection, unspecified: Secondary | ICD-10-CM

## 2022-07-07 DIAGNOSIS — J029 Acute pharyngitis, unspecified: Secondary | ICD-10-CM | POA: Diagnosis not present

## 2022-07-07 MED ORDER — CHLORASEPTIC 1.4 % MT LIQD: 1.0000 | OROMUCOSAL | 0 refills | Status: DC | PRN

## 2022-07-07 MED ORDER — BENZONATATE 100 MG PO CAPS: 100.0000 mg | ORAL_CAPSULE | Freq: Three times a day (TID) | ORAL | 0 refills | Status: DC | PRN

## 2022-07-07 NOTE — ED Provider Notes (Signed)
[facility]    CSN: [id number] Arrival date & time: 07/07/22  0912      History   Chief Complaint Chief Complaint  Patient presents with   Sore Throat   Headache    HPI Sheila Zhang is a 52 y.o. female.   Patient presents for follow-up for nasal congestion, sore throat, cough.  Patient was seen yesterday at urgent care for the same symptoms.  She states that she woke up this morning and was feeling worse.  She received a Toradol shot yesterday in urgent care which helped her feel better until awakening this morning.  She denies any known fevers at home.  States that she has not taken any medications over-the-counter for symptoms after being instructed to do so yesterday.   Sore Throat  Headache   Past Medical History:  Diagnosis Date   Anxiety    Dyslipidemia     Patient Active Problem List   Diagnosis Date Noted   Lateral epicondylitis, left elbow 05/12/2021   Generalized anxiety disorder 03/14/2021    Past Surgical History:  Procedure Laterality Date   tubaligation      OB History   No obstetric history on file.      Home Medications    Prior to Admission medications   Medication Sig Start Date End Date Taking? Authorizing Provider  benzonatate (TESSALON) 100 MG capsule Take 1 capsule (100 mg total) by mouth every 8 (eight) hours as needed for cough. 07/07/22  Yes Dennise Raabe, Hildred Alamin E, FNP  phenol (CHLORASEPTIC) 1.4 % LIQD Use as directed 1 spray in the mouth or throat as needed for throat irritation / pain. 07/07/22  Yes Lundy Cozart, Hildred Alamin E, FNP  atorvastatin (LIPITOR) 80 MG tablet TAKE 1 TABLET BY MOUTH EVERY DAY 12/31/21   Kerin Perna, NP  cetirizine (ZYRTEC ALLERGY) 10 MG tablet Take 1 tablet (10 mg total) by mouth at bedtime. 09/06/21 12/05/21  Lynden Oxford Scales, PA-C  fluticasone (FLONASE) 50 MCG/ACT nasal spray Place 1 spray into both nostrils daily. Begin by using 2 sprays in each nare daily for 3 to 5 days, then decrease to 1  spray in each nare daily. 09/06/21   Lynden Oxford Scales, PA-C  hydrOXYzine (ATARAX) 50 MG tablet TAKE 1 TABLET BY MOUTH EVERYDAY AT BEDTIME. NEED PRIMARY INSURANCE 06/25/22   Eulis Canner E, NP  venlafaxine XR (EFFEXOR-XR) 37.5 MG 24 hr capsule Take 1 capsule (37.5 mg total) by mouth daily. 06/25/22   Salley Slaughter, NP    Family History Family History  Problem Relation Age of Onset   Colon cancer Neg Hx    Colon polyps Neg Hx    Esophageal cancer Neg Hx    Stomach cancer Neg Hx    Rectal cancer Neg Hx     Social History Social History   Tobacco Use   Smoking status: Never   Smokeless tobacco: Never  Substance Use Topics   Alcohol use: Never   Drug use: Never     Allergies   Escitalopram   Review of Systems Review of Systems Per HPI  Physical Exam Triage Vital Signs ED Triage Vitals  Enc Vitals Group     BP 07/07/22 0918 119/79     Pulse Rate 07/07/22 0918 (!) 104     Resp 07/07/22 0918 16     Temp 07/07/22 0918 99 F (37.2 C)     Temp Source 07/07/22 0918 Oral     SpO2 07/07/22 0923 100 %  Weight --      Height --      Head Circumference --      Peak Flow --      Pain Score --      Pain Loc --      Pain Edu? --      Excl. in Derma? --    No data found.  Updated Vital Signs BP 119/79   Pulse (!) 104   Temp 99 F (37.2 C) (Oral)   Resp 16   SpO2 100%   Visual Acuity Right Eye Distance:   Left Eye Distance:   Bilateral Distance:    Right Eye Near:   Left Eye Near:    Bilateral Near:     Physical Exam Constitutional:      General: She is not in acute distress.    Appearance: Normal appearance. She is not toxic-appearing or diaphoretic.  HENT:     Head: Normocephalic and atraumatic.     Right Ear: Tympanic membrane and ear canal normal.     Left Ear: Tympanic membrane and ear canal normal.     Nose: Congestion present.     Mouth/Throat:     Mouth: Mucous membranes are moist.     Pharynx: Posterior oropharyngeal erythema  present.  Eyes:     Extraocular Movements: Extraocular movements intact.     Conjunctiva/sclera: Conjunctivae normal.     Pupils: Pupils are equal, round, and reactive to light.  Cardiovascular:     Rate and Rhythm: Normal rate and regular rhythm.     Pulses: Normal pulses.     Heart sounds: Normal heart sounds.  Pulmonary:     Effort: Pulmonary effort is normal. No respiratory distress.     Breath sounds: Normal breath sounds. No wheezing.  Abdominal:     General: Abdomen is flat. Bowel sounds are normal.     Palpations: Abdomen is soft.  Musculoskeletal:        General: Normal range of motion.     Cervical back: Normal range of motion.  Skin:    General: Skin is warm and dry.  Neurological:     General: No focal deficit present.     Mental Status: She is alert and oriented to person, place, and time. Mental status is at baseline.  Psychiatric:        Mood and Affect: Mood normal.        Behavior: Behavior normal.      UC Treatments / Results  Labs (all labs ordered are listed, but only abnormal results are displayed) Labs Reviewed - No data to display  EKG   Radiology No results found.  Procedures Procedures (including critical care time)  Medications Ordered in UC Medications - No data to display  Initial Impression / Assessment and Plan / UC Course  I have reviewed the triage vital signs and the nursing notes.  Pertinent labs & imaging results that were available during my care of the patient were reviewed by me and considered in my medical decision making (see chart for details).     Patient presents today for persistent viral illness.  She was evaluated yesterday and had negative strep test.  Throat culture is in process.  She declined COVID testing yesterday.  Offered patient flu testing today but she declined.  Discussed with patient that this is a viral illness that will have to run its course and we treat this symptomatically.  Patient has low-grade  temp today which is most likely  causing mildly increased heart rate on initial triage.  Although, heart rate appears normal on physical exam.  Heart rate is only mildly elevated from baseline yesterday.  Do not think any further workup or treatment is necessary for this due to this as it is most likely due to fever.  Patient appears physically stable and is safe for discharge. Patient was sent prescriptions to help alleviate symptoms.  Advised her to follow-up if symptoms persist or worsen.  Patient verbalized understanding and was agreeable with plan. Final Clinical Impressions(s) / UC Diagnoses   Final diagnoses:  Viral upper respiratory tract infection with cough  Sore throat     Discharge Instructions      You have a viral illness which will have to run its course.  We treat this symptomatically.  I have prescribed you 2 medications to help alleviate symptoms.  Follow-up if any symptoms persist or worsen.    ED Prescriptions     Medication Sig Dispense Auth. Provider   benzonatate (TESSALON) 100 MG capsule Take 1 capsule (100 mg total) by mouth every 8 (eight) hours as needed for cough. 21 capsule [place], Rosa Sanchez E, [place]   phenol (CHLORASEPTIC) 1.4 % LIQD Use as directed 1 spray in the mouth or throat as needed for throat irritation / pain. 118 mL Teodora Medici, [place]      PDMP not reviewed this encounter.   Teodora Medici, [place] 07/07/22 [phone]

## 2022-07-07 NOTE — ED Triage Notes (Signed)
Pt is here for follow-up appt- pt states she woke up feeling worse this morning. Sore throat,cough and nasal congestion.

## 2022-07-07 NOTE — Discharge Instructions (Signed)
You have a viral illness which will have to run its course.  We treat this symptomatically.  I have prescribed you 2 medications to help alleviate symptoms.  Follow-up if any symptoms persist or worsen.

## 2022-07-08 ENCOUNTER — Telehealth (HOSPITAL_COMMUNITY): Payer: Self-pay | Admitting: Emergency Medicine

## 2022-07-08 ENCOUNTER — Ambulatory Visit: Payer: No Typology Code available for payment source | Admitting: Family Medicine

## 2022-07-08 LAB — CULTURE, GROUP A STREP (THRC)

## 2022-07-08 MED ORDER — AMOXICILLIN 500 MG PO CAPS: 500.0000 mg | ORAL_CAPSULE | Freq: Two times a day (BID) | ORAL | 0 refills | Status: AC

## 2022-07-08 NOTE — Telephone Encounter (Signed)
Opened in error

## 2022-07-15 ENCOUNTER — Ambulatory Visit (INDEPENDENT_AMBULATORY_CARE_PROVIDER_SITE_OTHER): Payer: Medicaid Other | Admitting: Primary Care

## 2022-07-19 ENCOUNTER — Ambulatory Visit (INDEPENDENT_AMBULATORY_CARE_PROVIDER_SITE_OTHER): Payer: Medicaid Other | Admitting: Primary Care

## 2022-08-02 ENCOUNTER — Ambulatory Visit (INDEPENDENT_AMBULATORY_CARE_PROVIDER_SITE_OTHER): Payer: Medicaid Other | Admitting: Primary Care

## 2022-08-29 ENCOUNTER — Ambulatory Visit (INDEPENDENT_AMBULATORY_CARE_PROVIDER_SITE_OTHER): Payer: Medicaid Other

## 2022-08-29 ENCOUNTER — Ambulatory Visit
Admission: EM | Admit: 2022-08-29 | Discharge: 2022-08-29 | Disposition: A | Discharge status: Home / Self Care | Payer: Medicaid Other | Attending: Internal Medicine | Admitting: Internal Medicine

## 2022-08-29 DIAGNOSIS — M79642 Pain in left hand: Secondary | ICD-10-CM

## 2022-08-29 DIAGNOSIS — M25532 Pain in left wrist: Secondary | ICD-10-CM

## 2022-08-29 NOTE — ED Provider Notes (Signed)
[facility]    CSN: [id number] Arrival date & time: 08/29/22  4098      History   Chief Complaint Chief Complaint  Patient presents with   Wrist Pain    Left wrist pain x2 days    HPI Sheila Zhang is a 52 y.o. female.   Patient presents with left wrist and hand pain that started about 2 days ago.  Patient reports that she works at an [employer] and lifted up a heavy part.  She states that she subsequently felt "a sensation like rubber bands popping" in her left wrist afterwards.  Patient reports that she has been having tingling in all of her fingers.  Denies numbness.  Patient does report any medications for pain.   Wrist Pain    Past Medical History:  Diagnosis Date   Anxiety    Dyslipidemia     Patient Active Problem List   Diagnosis Date Noted   Lateral epicondylitis, left elbow 05/12/2021   Generalized anxiety disorder 03/14/2021    Past Surgical History:  Procedure Laterality Date   tubaligation      OB History   No obstetric history on file.      Home Medications    Prior to Admission medications   Medication Sig Start Date End Date Taking? Authorizing Provider  atorvastatin (LIPITOR) 80 MG tablet TAKE 1 TABLET BY MOUTH EVERY DAY 12/31/21  Yes Grayce Sessions, NP  benzonatate (TESSALON) 100 MG capsule Take 1 capsule (100 mg total) by mouth every 8 (eight) hours as needed for cough. 07/07/22  Yes Ranie Chinchilla, Rolly Salter E, FNP  fluticasone (FLONASE) 50 MCG/ACT nasal spray Place 1 spray into both nostrils daily. Begin by using 2 sprays in each nare daily for 3 to 5 days, then decrease to 1 spray in each nare daily. 09/06/21  Yes Theadora Rama Scales, PA-C  hydrOXYzine (ATARAX) 50 MG tablet TAKE 1 TABLET BY MOUTH EVERYDAY AT BEDTIME. NEED PRIMARY INSURANCE 06/25/22  Yes Toy Cookey E, NP  phenol (CHLORASEPTIC) 1.4 % LIQD Use as directed 1 spray in the mouth or throat as needed for throat irritation / pain. 07/07/22  Yes Isidro Monks, Rolly Salter  E, FNP  venlafaxine XR (EFFEXOR-XR) 37.5 MG 24 hr capsule Take 1 capsule (37.5 mg total) by mouth daily. 06/25/22  Yes Shanna Cisco, NP  cetirizine (ZYRTEC ALLERGY) 10 MG tablet Take 1 tablet (10 mg total) by mouth at bedtime. 09/06/21 12/05/21  Theadora Rama Scales, PA-C    Family History Family History  Problem Relation Age of Onset   Colon cancer Neg Hx    Colon polyps Neg Hx    Esophageal cancer Neg Hx    Stomach cancer Neg Hx    Rectal cancer Neg Hx     Social History Social History   Tobacco Use   Smoking status: Never   Smokeless tobacco: Never  Substance Use Topics   Alcohol use: Never   Drug use: Never     Allergies   Escitalopram   Review of Systems Review of Systems Per HPI  Physical Exam Triage Vital Signs ED Triage Vitals  Enc Vitals Group     BP 08/29/22 0950 105/71     Pulse Rate 08/29/22 0950 74     Resp 08/29/22 0950 17     Temp 08/29/22 0950 97.9 F (36.6 C)     Temp Source 08/29/22 0950 Oral     SpO2 08/29/22 0950 96 %     Weight 08/29/22 0949  210 lb (95.3 kg)     Height 08/29/22 0949 5\' 4"  (1.626 m)     Head Circumference --      Peak Flow --      Pain Score 08/29/22 0949 5     Pain Loc --      Pain Edu? --      Excl. in GC? --    No data found.  Updated Vital Signs BP 105/71 (BP Location: Left Arm)   Pulse 74   Temp 97.9 F (36.6 C) (Oral)   Resp 17   Ht 5\' 4"  (1.626 m)   Wt 210 lb (95.3 kg)   SpO2 96%   BMI 36.05 kg/m   Visual Acuity Right Eye Distance:   Left Eye Distance:   Bilateral Distance:    Right Eye Near:   Left Eye Near:    Bilateral Near:     Physical Exam Constitutional:      General: She is not in acute distress.    Appearance: Normal appearance. She is not toxic-appearing or diaphoretic.  HENT:     Head: Normocephalic and atraumatic.  Eyes:     Extraocular Movements: Extraocular movements intact.     Conjunctiva/sclera: Conjunctivae normal.  Pulmonary:     Effort: Pulmonary effort is  normal.  Musculoskeletal:     Comments: Patient reports that there is no tenderness to palpation throughout left wrist.  She does have some very mild tenderness to palpation throughout the dorsal surface of the left hand.  Reports pain is exacerbated in the wrist with any type of range of motion of the wrist.  Patient has full range of motion of fingers.  Grip strength is 5/5.  Capillary refill and pulses are intact.  No abrasions, lacerations, swelling, discoloration noted.  Neurological:     General: No focal deficit present.     Mental Status: She is alert and oriented to person, place, and time. Mental status is at baseline.  Psychiatric:        Mood and Affect: Mood normal.        Behavior: Behavior normal.        Thought Content: Thought content normal.        Judgment: Judgment normal.      UC Treatments / Results  Labs (all labs ordered are listed, but only abnormal results are displayed) Labs Reviewed - No data to display  EKG   Radiology DG Hand Complete Left  Result Date: 08/29/2022 CLINICAL DATA:  52 year old female with left wrist pain for 2 days. EXAM: LEFT HAND - COMPLETE 3+ VIEW COMPARISON:  Left wrist series today. FINDINGS: Bone mineralization is within normal limits. Joint spaces and alignment throughout the hand appear normal for age. No osseous abnormality identified. No discrete soft tissue abnormality. Left wrist detailed separately. IMPRESSION: Negative. Electronically Signed   By: Odessa Fleming M.D.   On: 08/29/2022 10:39   DG Wrist Complete Left  Result Date: 08/29/2022 CLINICAL DATA:  53 year old female with left wrist pain for 2 days. EXAM: LEFT WRIST - COMPLETE 3+ VIEW COMPARISON:  None Available. FINDINGS: Bone mineralization is within normal limits. Distal radius and ulna are intact. Carpal bone alignment and joint spaces maintained. Carpals and metacarpals appear intact. No acute osseous abnormality identified. Soft tissue contours within normal limits.  IMPRESSION: Negative. Electronically Signed   By: Odessa Fleming M.D.   On: 08/29/2022 10:38    Procedures Procedures (including critical care time)  Medications Ordered in UC Medications - No  data to display  Initial Impression / Assessment and Plan / UC Course  I have reviewed the triage vital signs and the nursing notes.  Pertinent labs & imaging results that were available during my care of the patient were reviewed by me and considered in my medical decision making (see chart for details).     Suspect wrist sprain given mechanism of injury.  No acute bony abnormalities noted on x-ray.  Patient is neurovascularly intact so do not think that emergent evaluation is necessary.  Will apply wrist brace today.  Advise supportive care including elevation of extremity, ice application, safe over-the-counter pain relievers.  Advised following up with orthopedist at provided contact information if symptoms persist or worsen.  Patient verbalized understanding and was agreeable with plan. Final Clinical Impressions(s) / UC Diagnoses   Final diagnoses:  Left wrist pain  Left hand pain     Discharge Instructions      X-ray was normal.  Suspect wrist sprain.  Wrist brace applied in urgent care.  Recommend not sleeping in this.  Apply ice and elevate extremity.  Take over-the-counter pain relievers as we discussed.  Follow-up orthopedist if symptoms persist or worsen.     ED Prescriptions   None    PDMP not reviewed this encounter.   Gustavus Bryant, [place] 08/29/22 1149

## 2022-08-29 NOTE — ED Triage Notes (Signed)
Pt states that she has some left wrist pain. X2 days

## 2022-08-29 NOTE — Discharge Instructions (Signed)
X-ray was normal.  Suspect wrist sprain.  Wrist brace applied in urgent care.  Recommend not sleeping in this.  Apply ice and elevate extremity.  Take over-the-counter pain relievers as we discussed.  Follow-up orthopedist if symptoms persist or worsen.

## 2022-08-30 ENCOUNTER — Telehealth (INDEPENDENT_AMBULATORY_CARE_PROVIDER_SITE_OTHER): Payer: Medicaid Other | Admitting: Psychiatry

## 2022-08-30 ENCOUNTER — Other Ambulatory Visit (HOSPITAL_COMMUNITY): Payer: Self-pay | Admitting: Psychiatry

## 2022-08-30 ENCOUNTER — Encounter (HOSPITAL_COMMUNITY): Payer: Self-pay | Admitting: Psychiatry

## 2022-08-30 DIAGNOSIS — F32A Depression, unspecified: Secondary | ICD-10-CM

## 2022-08-30 DIAGNOSIS — F411 Generalized anxiety disorder: Secondary | ICD-10-CM

## 2022-08-30 MED ORDER — VENLAFAXINE HCL ER 37.5 MG PO CP24: 37.5000 mg | ORAL_CAPSULE | Freq: Every day | ORAL | 3 refills | Status: DC

## 2022-08-30 MED ORDER — HYDROXYZINE HCL 50 MG PO TABS
ORAL_TABLET | ORAL | 3 refills | Status: DC
Start: 2022-08-30 — End: 2022-11-10

## 2022-08-30 NOTE — Progress Notes (Signed)
BH MD/PA/NP OP Progress Note Virtual Visit via Video Note  I connected with Sheila Zhang on 08/30/22 at 11:30 AM EDT by a video enabled telemedicine application and verified that I am speaking with the correct person using two identifiers.  Location: Patient: Work Provider: Clinic   I discussed the limitations of evaluation and management by telemedicine and the availability of in person appointments. The patient expressed understanding and agreed to proceed.  I provided 30 minutes of non-face-to-face time during this encounter.    08/30/2022 11:58 AM Sheila Zhang  MRN:  [id number]  Chief Complaint: "I am drained"  HPI: 52 year old female seen today for follow-up psychiatric evaluation.  She has a psychiatric history of anxiety.  She is currently being managed on Effexor 37.5 mg and hydroxyzine 50 mg nightly.  She notes her medications are effective in managing her psychiatric condition.  Today patient was pleasant, cooperative, and engaged in conversation.  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 Recently she sprained her wrist at work ([employer]). She also notes that she feels overwhelmed with caring for her mother. She notes that her mother has been staying with her for 2 months. She notes that had a stroke and is having difficulty with her memory.  Patient reports that at times her mother can be rude and notes bothers her.  She reports that she has little support from her sister. Patient also notes that she worries about her children.  Despite the stressors patient informed writer that her anxiety and depression are well-managed.  Provider conducted a GAD-7 and patient scored a 9, at her last visit she scored 19.  Provider also conducted PHQ-9 of he scored 4, at her last visit she scored a 10.  She endorses adequate sleep and appetite.   Today she denies SI/HI/AVH, mania, paranoia.    Patient reports that the above stressors are situational.  At this time she requested no  medication changes.  Patient agreeable to continue medication as prescribed.  No other concerns noted at this time.  Visit Diagnosis:    ICD-10-CM   1. Generalized anxiety disorder  F41.1 venlafaxine XR (EFFEXOR-XR) 37.5 MG 24 hr capsule    hydrOXYzine (ATARAX) 50 MG tablet    2. Mild depression  F32.A venlafaxine XR (EFFEXOR-XR) 37.5 MG 24 hr capsule       Past Psychiatric History: Anxiety  Past Medical History:  Past Medical History:  Diagnosis Date   Anxiety    Dyslipidemia     Past Surgical History:  Procedure Laterality Date   tubaligation      Family Psychiatric History: Mother: anxiety, suicidal ideations, depression. Sister: anxiety.  Family History:  Family History  Problem Relation Age of Onset   Colon cancer Neg Hx    Colon polyps Neg Hx    Esophageal cancer Neg Hx    Stomach cancer Neg Hx    Rectal cancer Neg Hx     Social History:  Social History   Socioeconomic History   Marital status: Single    Spouse name: Not on file   Number of children: Not on file   Years of education: Not on file   Highest education level: Not on file  Occupational History   Not on file  Tobacco Use   Smoking status: Never   Smokeless tobacco: Never  Substance and Sexual Activity   Alcohol use: Never   Drug use: Never   Sexual activity: Not on file  Other Topics Concern   Not on file  Social History Narrative   Not on file   Social Determinants of Health   Financial Resource Strain: Not on file  Food Insecurity: Not on file  Transportation Needs: Not on file  Physical Activity: Not on file  Stress: Not on file  Social Connections: Not on file    Allergies:  Allergies  Allergen Reactions   Escitalopram     Burning sensation     Metabolic Disorder Labs: Lab Results  Component Value Date   HGBA1C 5.7 01/05/2021   No results found for: "PROLACTIN" Lab Results  Component Value Date   CHOL 241 (H) 01/05/2021   TRIG 264 (H) 01/05/2021   HDL 57  01/05/2021   CHOLHDL 4.2 01/05/2021   LDLCALC 137 (H) 01/05/2021   Lab Results  Component Value Date   TSH 1.030 01/05/2021    Therapeutic Level Labs: No results found for: "LITHIUM" No results found for: "VALPROATE" No results found for: "CBMZ"  Current Medications: Current Outpatient Medications  Medication Sig Dispense Refill   atorvastatin (LIPITOR) 80 MG tablet TAKE 1 TABLET BY MOUTH EVERY DAY 90 tablet 0   benzonatate (TESSALON) 100 MG capsule Take 1 capsule (100 mg total) by mouth every 8 (eight) hours as needed for cough. 21 capsule 0   cetirizine (ZYRTEC ALLERGY) 10 MG tablet Take 1 tablet (10 mg total) by mouth at bedtime. 30 tablet 2   fluticasone (FLONASE) 50 MCG/ACT nasal spray Place 1 spray into both nostrils daily. Begin by using 2 sprays in each nare daily for 3 to 5 days, then decrease to 1 spray in each nare daily. 32 mL 1   hydrOXYzine (ATARAX) 50 MG tablet TAKE 1 TABLET BY MOUTH EVERYDAY AT BEDTIME. NEED PRIMARY INSURANCE 90 tablet 3   phenol (CHLORASEPTIC) 1.4 % LIQD Use as directed 1 spray in the mouth or throat as needed for throat irritation / pain. 118 mL 0   venlafaxine XR (EFFEXOR-XR) 37.5 MG 24 hr capsule Take 1 capsule (37.5 mg total) by mouth daily. 90 capsule 3   No current facility-administered medications for this visit.     Musculoskeletal: Strength & Muscle Tone: within normal limits Gait & Station: normal, Telehealth Patient leans: N/A  Psychiatric Specialty Exam: Review of Systems  There were no vitals taken for this visit.There is no height or weight on file to calculate BMI.  General Appearance: Well Groomed  Eye Contact:  Good  Speech:  Clear and Coherent and Normal Rate  Volume:  Normal  Mood:  Euthymic  Affect:  Appropriate and Congruent  Thought Process:  Coherent, Goal Directed, and Linear  Orientation:  Full (Time, Place, and Person)  Thought Content: WDL and Logical   Suicidal Thoughts:  No  Homicidal Thoughts:  No  Memory:   Immediate;   Good Recent;   Good Remote;   Good  Judgement:  Good  Insight:  Good  Psychomotor Activity:  Normal  Concentration:  Concentration: Good and Attention Span: Good  Recall:  Good  Fund of Knowledge: Good  Language: Good  Akathisia:  No  Handed:  Right  AIMS (if indicated): not done  Assets:  Communication Skills Desire for Improvement Financial Resources/Insurance Housing Intimacy Physical Health Social Support Transportation Vocational/Educational  ADL's:  Intact  Cognition: WNL  Sleep:  Good   Screenings: GAD-7    Flowsheet Row Video Visit from 08/30/2022 in [facility] Video Visit from 06/25/2022 in [facility] Video Visit from 03/26/2022 in [facility] Video Visit from 12/23/2021 in [facility] Main Office Visit from 09/28/2021 in [facility]  Total GAD-7 Score 9 19 13 18 15       PHQ2-9    Flowsheet Row Video Visit from 08/30/2022 in [facility] Video Visit from 06/25/2022 in [facility] Video Visit from 03/26/2022 in [facility] Video Visit from 12/23/2021 in [facility] Office Visit from 09/28/2021 in [facility]  PHQ-2 Total Score 3 3 2 3 2   PHQ-9 Total Score 4 10 3 14 6       Flowsheet Row ED from 07/06/2022 in [facility] at [facility] ED from 05/25/2022 in [facility] at [facility] ED from 05/05/2022 in [facility] at [facility])  C-SSRS RISK CATEGORY No Risk No Risk No Risk        Assessment and Plan: Patient notes that her anxiety and depression has improved since her last visit.  She does note that she has situational stressors but is able to cope with it.  No medication changes made today.   Patient improved continue medication as prescribed   1. Generalized anxiety disorder  Continue- venlafaxine XR (EFFEXOR XR) 37.5 MG 24 hr capsule; Take 1 capsule (37.5 mg total) by mouth daily.  Dispense: 30 capsule; Refill: 3 Continue- hydrOXYzine (ATARAX) 50 MG tablet; Take 1 tablet (50 mg total) by mouth at bedtime.  Dispense: 60 tablet; Refill: 3  2. Mild depression  Continue- venlafaxine XR (EFFEXOR XR) 37.5 MG 24 hr capsule; Take 1 capsule (37.5 mg total) by mouth daily.  Dispense: 30 capsule; Refill: 3  Follow-up in 2.5 months   Shanna Cisco, NP 08/30/2022, 11:58 AM

## 2022-09-02 ENCOUNTER — Encounter (INDEPENDENT_AMBULATORY_CARE_PROVIDER_SITE_OTHER): Payer: Self-pay | Admitting: Primary Care

## 2022-09-02 ENCOUNTER — Ambulatory Visit (INDEPENDENT_AMBULATORY_CARE_PROVIDER_SITE_OTHER): Payer: Medicaid Other | Admitting: Primary Care

## 2022-09-02 VITALS — BP 109/74 | HR 80 | Resp 16 | Ht 64.0 in | Wt 214.2 lb

## 2022-09-02 DIAGNOSIS — Z1211 Encounter for screening for malignant neoplasm of colon: Secondary | ICD-10-CM

## 2022-09-02 DIAGNOSIS — J029 Acute pharyngitis, unspecified: Secondary | ICD-10-CM

## 2022-09-06 NOTE — Progress Notes (Signed)
.  me  Acute Office Visit  Subjective:     Patient ID: Sheila Zhang, female    DOB: 1970-05-13, 52 y.o.   MRN: [id number]  Chief Complaint  Patient presents with   Cyst    Noticed a couple weeks ago It's gone now     HPI Ms.Sheila Zhang is a 52 year old female who presents with a concern of a cyst and sore throat which has self resolved.  The cyst that she felt was a swollen lymph node under her chin (cervical chain ) .  She did not experience any fever or chills, hoarseness. Patient has No headache, No chest pain, No abdominal pain - No Nausea, No new weakness tingling or numbness, No Cough - shortness of breath   ROS Comprehensive ROS Pertinent positive and negative noted in HPI       Objective:    Blood Pressure 109/74   Pulse 80   Respiration 16   Height 5\' 4"  (1.626 m)   Weight 214 lb 3.2 oz (97.2 kg)   Oxygen Saturation 97%   Zhang Mass Index 36.77 kg/m   Physical Exam Constitutional:      Appearance: She is obese.  HENT:     Head: Normocephalic.     Right Ear: Tympanic membrane and external ear normal.     Left Ear: Tympanic membrane and external ear normal.     Nose: Nose normal.     Mouth/Throat:     Comments: Erythema without any discoloration or swollen tonsils Eyes:     Extraocular Movements: Extraocular movements intact.  Cardiovascular:     Rate and Rhythm: Normal rate and regular rhythm.  Pulmonary:     Effort: Pulmonary effort is normal.     Breath sounds: Normal breath sounds.  Abdominal:     General: Bowel sounds are normal. There is distension.     Palpations: Abdomen is soft.  Musculoskeletal:        General: Normal range of motion.  Skin:    General: Skin is warm and dry.  Neurological:     Mental Status: She is alert and oriented to person, place, and time.  Psychiatric:        Mood and Affect: Mood normal.        Behavior: Behavior normal.   No results found for any visits on 09/02/22.      Assessment & Plan:   Sheila Zhang was seen today for cyst.  Diagnoses and all orders for this visit:  Colon cancer screening -     Ambulatory referral to Gastroenterology  Acute sore throat May gargle with Listerine or salt water no signs and symptoms of infection at this time. Self resolved   Return in about 3 months (around 12/03/2022) for annual physical.  Grayce Sessions, NP

## 2022-09-10 ENCOUNTER — Telehealth (HOSPITAL_COMMUNITY): Payer: Medicaid Other | Admitting: Psychiatry

## 2022-09-20 ENCOUNTER — Ambulatory Visit (INDEPENDENT_AMBULATORY_CARE_PROVIDER_SITE_OTHER): Payer: Self-pay

## 2022-09-20 DIAGNOSIS — E782 Mixed hyperlipidemia: Secondary | ICD-10-CM

## 2022-09-20 NOTE — Telephone Encounter (Signed)
Will forward to provider  

## 2022-09-20 NOTE — Telephone Encounter (Signed)
  Chief Complaint: Requesting med refills Symptoms: Allergy s/s, out of atorvastatin Frequency: now Pertinent Negatives: Patient denies  Disposition: [] ED /[] Urgent Care (no appt availability in office) / [] Appointment(In office/virtual)/ []  [place] [facility]/ [] Home Care/ [] Refused Recommended Disposition /[] [place] [facility]/ [x]  Follow-up with PCP Additional Notes: Pt is requesting refills on her allergy medications. She has s/s of allergies. Pt is also requesting a refill of atorvastatin.  Made follow up appt for August . Pt would like to know if labs will be drawn that day and if she will ned to fast. Please advise.    Summary: Burning in eyes, scratchy throat   The patient called in stating for about the past two days she has experienced burning in her eyes, scratchy throat, sneezing and itching in and around her ears. She gets like this when the weather changes and she was previously prescribed cetirizine (ZYRTEC ALLERGY) 10 MG tablet which along with Flonase really seems to really help her. Please assist her further as her provider doesn't have any appts for over a week.       Reason for Disposition  [1] Prescription refill request for NON-ESSENTIAL medicine (i.e., no harm to patient if med not taken) AND [2] triager unable to refill per department policy  Answer Assessment - Initial Assessment Questions 1. DRUG NAME: "What medicine do you need to have refilled?"     Allergy medication and atorvastatin 2. REFILLS REMAINING: "How many refills are remaining?" (Note: The label on the medicine or pill bottle will show how many refills are remaining. If there are no refills remaining, then a renewal may be needed.)     none 5. SYMPTOMS: "Do you have any symptoms?"     Allergy s/s  Protocols used: Medication Refill and Renewal Call-A-AH

## 2022-10-26 ENCOUNTER — Encounter: Payer: Self-pay | Admitting: Primary Care

## 2022-11-01 ENCOUNTER — Ambulatory Visit (INDEPENDENT_AMBULATORY_CARE_PROVIDER_SITE_OTHER): Payer: Self-pay

## 2022-11-01 NOTE — Telephone Encounter (Signed)
Chief Complaint: Right hip pain Symptoms: 4/10, constant pain, aching Frequency: Onset 1 week ago Pertinent Negatives: Patient denies other symptoms Disposition: [] ED /[] Urgent Care (no appt availability in office) / [] Appointment(In office/virtual)/ []  [facility]/ [] Home Care/ [] Refused Recommended Disposition /[x] [facility]/ []  Follow-up with PCP Additional Notes: Patient says her pain is not that bad and she can wait to see the provider on 11/22/22 previously scheduled, was checking to see if earlier appt, advised none seen. Added to wait list. Advised Mobile Bus location/hours today and Wednesday, she says she works and will let her boss know that she will need to get off early on Wednesday to go.    Reason for Disposition  [1] MODERATE pain (e.g., interferes with normal activities, limping) AND [2] present > 3 days  Answer Assessment - Initial Assessment Questions 1. LOCATION and RADIATION: "Where is the pain located?"      Right hip 2. QUALITY: "What does the pain feel like?"  (e.g., sharp, dull, aching, burning)     Aching 3. SEVERITY: "How bad is the pain?" "What does it keep you from doing?"   (Scale 1-10; or mild, moderate, severe)   -  MILD (1-3): doesn't interfere with normal activities    -  MODERATE (4-7): interferes with normal activities (e.g., work or school) or awakens from sleep, limping    -  SEVERE (8-10): excruciating pain, unable to do any normal activities, unable to walk     4 4. ONSET: "When did the pain start?" "Does it come and go, or is it there all the time?"     Onset a week 5. WORK OR EXERCISE: "Has there been any recent work or exercise that involved this part of the body?"      No 6. CAUSE: "What do you think is causing the hip pain?"      I don't know 7. AGGRAVATING FACTORS: "What makes the hip pain worse?" (e.g., walking, climbing stairs, running)     Walking, going up stairs 8. OTHER SYMPTOMS: "Do you have any other symptoms?"  (e.g., back pain, pain shooting down leg,  fever, rash)     No  Protocols used: Hip Pain-A-AH

## 2022-11-01 NOTE — Telephone Encounter (Signed)
Returned pt call and scheduled an appt for 11/02/22 at 8:30am

## 2022-11-02 ENCOUNTER — Ambulatory Visit (INDEPENDENT_AMBULATORY_CARE_PROVIDER_SITE_OTHER): Payer: Medicaid Other

## 2022-11-09 ENCOUNTER — Encounter: Payer: Self-pay | Admitting: *Deleted

## 2022-11-09 ENCOUNTER — Other Ambulatory Visit: Payer: Self-pay

## 2022-11-09 ENCOUNTER — Ambulatory Visit
Admission: EM | Admit: 2022-11-09 | Discharge: 2022-11-09 | Disposition: A | Discharge status: Home / Self Care | Payer: Medicaid Other | Attending: Physician Assistant | Admitting: Physician Assistant

## 2022-11-09 DIAGNOSIS — M5441 Lumbago with sciatica, right side: Secondary | ICD-10-CM

## 2022-11-09 DIAGNOSIS — M25551 Pain in right hip: Secondary | ICD-10-CM | POA: Diagnosis not present

## 2022-11-09 MED ORDER — PREDNISONE 20 MG PO TABS: 40.0000 mg | ORAL_TABLET | Freq: Every day | ORAL | 0 refills | Status: AC

## 2022-11-09 NOTE — ED Triage Notes (Signed)
Pt reports for past couple of weeks having back pain that shoots down the front of leg. Pain has been present for 2 weeks. Pt has tried tylenol with out relief.

## 2022-11-09 NOTE — ED Provider Notes (Signed)
[facility]    CSN: [id number] Arrival date & time: 11/09/22  7253      History   Chief Complaint Chief Complaint  Patient presents with   Back Pain    HPI Sheila Zhang is a 52 y.o. female.   Patient here today for evaluation of low back pain that radiates to the front of her right leg that is been present for 2 weeks.  She also notes some lateral right hip pain.  She states that walking, sitting, lying causes pain.  She has tried taking Tylenol without significant relief.  She denies any weakness.  She has not had any recent injury.  She does have history of sciatica and states symptoms feel similar.  Pain currently radiates to her knee.   The history is provided by the patient.  Back Pain Associated symptoms: no fever and no weakness     Past Medical History:  Diagnosis Date   Anxiety    Dyslipidemia     Patient Active Problem List   Diagnosis Date Noted   Lateral epicondylitis, left elbow 05/12/2021   Generalized anxiety disorder 03/14/2021    Past Surgical History:  Procedure Laterality Date   tubaligation      OB History   No obstetric history on file.      Home Medications    Prior to Admission medications   Medication Sig Start Date End Date Taking? Authorizing Provider  atorvastatin (LIPITOR) 80 MG tablet TAKE 1 TABLET BY MOUTH EVERY DAY 12/31/21  Yes Grayce Sessions, NP  predniSONE (DELTASONE) 20 MG tablet Take 2 tablets (40 mg total) by mouth daily with breakfast for 5 days. 11/09/22 11/14/22 Yes Tomi Bamberger, PA-C  venlafaxine XR (EFFEXOR-XR) 37.5 MG 24 hr capsule TAKE 1 CAPSULE BY MOUTH EVERY DAY 08/30/22  Yes Toy Cookey E, NP  cetirizine (ZYRTEC ALLERGY) 10 MG tablet Take 1 tablet (10 mg total) by mouth at bedtime. 09/06/21 12/05/21  Theadora Rama Scales, PA-C  hydrOXYzine (ATARAX) 50 MG tablet TAKE 1 TABLET BY MOUTH EVERYDAY AT BEDTIME. NEED PRIMARY INSURANCE 08/30/22   Shanna Cisco, NP    Family  History Family History  Problem Relation Age of Onset   Colon cancer Neg Hx    Colon polyps Neg Hx    Esophageal cancer Neg Hx    Stomach cancer Neg Hx    Rectal cancer Neg Hx     Social History Social History   Tobacco Use   Smoking status: Never   Smokeless tobacco: Never  Substance Use Topics   Alcohol use: Never   Drug use: Never     Allergies   Escitalopram   Review of Systems Review of Systems  Constitutional:  Negative for chills and fever.  Eyes:  Negative for discharge and redness.  Gastrointestinal:  Negative for nausea and vomiting.  Musculoskeletal:  Positive for arthralgias and back pain.  Neurological:  Negative for weakness.     Physical Exam Triage Vital Signs ED Triage Vitals  Encounter Vitals Group     BP 11/09/22 0913 105/75     Systolic BP Percentile --      Diastolic BP Percentile --      Pulse Rate 11/09/22 0913 76     Resp 11/09/22 0913 18     Temp 11/09/22 0913 98.1 F (36.7 C)     Temp src --      SpO2 11/09/22 0913 94 %     Weight --  Height --      Head Circumference --      Peak Flow --      Pain Score 11/09/22 0911 6     Pain Loc --      Pain Education --      Exclude from Growth Chart --    No data found.  Updated Vital Signs BP 105/75   Pulse 76   Temp 98.1 F (36.7 C)   Resp 18   SpO2 94%      Physical Exam Vitals and nursing note reviewed.  Constitutional:      General: She is not in acute distress.    Appearance: Normal appearance. She is not ill-appearing.  HENT:     Head: Normocephalic and atraumatic.  Eyes:     Conjunctiva/sclera: Conjunctivae normal.  Cardiovascular:     Rate and Rhythm: Normal rate.  Pulmonary:     Effort: Pulmonary effort is normal. No respiratory distress.  Musculoskeletal:     Comments: No tenderness to palpation to midline spine diffusely, mild tenderness to palpation noted to right lower back/ right lateral hip  Neurological:     Mental Status: She is alert.   Psychiatric:        Mood and Affect: Mood normal.        Behavior: Behavior normal.        Thought Content: Thought content normal.      UC Treatments / Results  Labs (all labs ordered are listed, but only abnormal results are displayed) Labs Reviewed - No data to display  EKG   Radiology No results found.  Procedures Procedures (including critical care time)  Medications Ordered in UC Medications - No data to display  Initial Impression / Assessment and Plan / UC Course  I have reviewed the triage vital signs and the nursing notes.  Pertinent labs & imaging results that were available during my care of the patient were reviewed by me and considered in my medical decision making (see chart for details).    Will treat to cover sciatica with steroid burst but also discussed possible bursitis given lateral hip pain.  Recommended continued monitoring and follow-up if no gradual improvement with treatment or with any further concerns.  Final Clinical Impressions(s) / UC Diagnoses   Final diagnoses:  Right hip pain  Acute right-sided low back pain with right-sided sciatica   Discharge Instructions   None    ED Prescriptions     Medication Sig Dispense Auth. Provider   predniSONE (DELTASONE) 20 MG tablet Take 2 tablets (40 mg total) by mouth daily with breakfast for 5 days. 10 tablet Tomi Bamberger, PA-C      PDMP not reviewed this encounter.   Tomi Bamberger, PA-C 11/09/22 1204

## 2022-11-10 ENCOUNTER — Telehealth (HOSPITAL_COMMUNITY): Payer: Medicaid Other | Admitting: Psychiatry

## 2022-11-10 ENCOUNTER — Encounter (HOSPITAL_COMMUNITY): Payer: Self-pay | Admitting: Psychiatry

## 2022-11-10 DIAGNOSIS — F411 Generalized anxiety disorder: Secondary | ICD-10-CM

## 2022-11-10 DIAGNOSIS — F32A Depression, unspecified: Secondary | ICD-10-CM | POA: Diagnosis not present

## 2022-11-10 MED ORDER — HYDROXYZINE HCL 50 MG PO TABS
ORAL_TABLET | ORAL | 3 refills | Status: DC
Start: 2022-11-10 — End: 2022-12-14

## 2022-11-10 MED ORDER — VENLAFAXINE HCL ER 37.5 MG PO CP24
37.5000 mg | ORAL_CAPSULE | Freq: Every day | ORAL | 3 refills | Status: DC
Start: 2022-11-10 — End: 2023-02-02

## 2022-11-10 NOTE — Progress Notes (Signed)
BH MD/PA/NP OP Progress Note Virtual Visit via Video Note  I connected with Sheila Zhang on 11/10/22 at  9:30 AM EDT by a video enabled telemedicine application and verified that I am speaking with the correct person using two identifiers.  Location: Patient: Home Provider: Clinic   I discussed the limitations of evaluation and management by telemedicine and the availability of in person appointments. The patient expressed understanding and agreed to proceed.  I provided 30 minutes of non-face-to-face time during this encounter.    11/10/2022 9:37 AM Sheila Zhang  MRN:  [id number]  Chief Complaint: "I am getting better"  HPI: 52 year old female seen today for follow-up psychiatric evaluation.  She has a psychiatric history of anxiety.  She is currently being managed on Effexor 37.5 mg and hydroxyzine 50 mg nightly.  She notes her medications are effective in managing her psychiatric condition.  Today patient was pleasant, cooperative, and engaged in conversation.  She informed Clinical research associate that she is getting better.  She notes that yesterday she went to the urgent care because she was having right hip pain.  She notes that she may have arthritis or bursitis.  She inform her that she will still follow-up with her PCP to address these concerns.  Currently she is taking prednisone to help manage her pain.    Mentally patient notes that she is doing well.  She does Archivist that at times she worries about her mother who she now cares. She reports that her mother has cancer.  Provider conducted GAD-7 and patient scored an 11, at her last visit she scored a 9. Provider also conducted PHQ-9 of he scored 5, at her last visit she scored a 4.  She endorses adequate sleep and appetite.   Today she denies SI/HI/AVH, mania, paranoia.    No medication changes made today.  Patient agreeable to continue medication as prescribed.  No other concerns noted at this time.  Visit Diagnosis:     ICD-10-CM   1. Generalized anxiety disorder  F41.1 hydrOXYzine (ATARAX) 50 MG tablet    venlafaxine XR (EFFEXOR-XR) 37.5 MG 24 hr capsule    2. Mild depression  F32.A venlafaxine XR (EFFEXOR-XR) 37.5 MG 24 hr capsule        Past Psychiatric History: Anxiety  Past Medical History:  Past Medical History:  Diagnosis Date   Anxiety    Dyslipidemia     Past Surgical History:  Procedure Laterality Date   tubaligation      Family Psychiatric History: Mother: anxiety, suicidal ideations, depression. Sister: anxiety.  Family History:  Family History  Problem Relation Age of Onset   Colon cancer Neg Hx    Colon polyps Neg Hx    Esophageal cancer Neg Hx    Stomach cancer Neg Hx    Rectal cancer Neg Hx     Social History:  Social History   Socioeconomic History   Marital status: Single    Spouse name: Not on file   Number of children: Not on file   Years of education: Not on file   Highest education level: Not on file  Occupational History   Not on file  Tobacco Use   Smoking status: Never   Smokeless tobacco: Never  Substance and Sexual Activity   Alcohol use: Never   Drug use: Never   Sexual activity: Not on file  Other Topics Concern   Not on file  Social History Narrative   Not on file   Social Determinants of Health  Financial Resource Strain: Not on file  Food Insecurity: Not on file  Transportation Needs: Not on file  Physical Activity: Not on file  Stress: Not on file  Social Connections: Not on file    Allergies:  Allergies  Allergen Reactions   Escitalopram     Burning sensation     Metabolic Disorder Labs: Lab Results  Component Value Date   HGBA1C 5.7 01/05/2021   No results found for: "PROLACTIN" Lab Results  Component Value Date   CHOL 241 (H) 01/05/2021   TRIG 264 (H) 01/05/2021   HDL 57 01/05/2021   CHOLHDL 4.2 01/05/2021   LDLCALC 137 (H) 01/05/2021   Lab Results  Component Value Date   TSH 1.030 01/05/2021     Therapeutic Level Labs: No results found for: "LITHIUM" No results found for: "VALPROATE" No results found for: "CBMZ"  Current Medications: Current Outpatient Medications  Medication Sig Dispense Refill   atorvastatin (LIPITOR) 80 MG tablet TAKE 1 TABLET BY MOUTH EVERY DAY 90 tablet 0   cetirizine (ZYRTEC ALLERGY) 10 MG tablet Take 1 tablet (10 mg total) by mouth at bedtime. 30 tablet 2   hydrOXYzine (ATARAX) 50 MG tablet TAKE 1 TABLET BY MOUTH EVERYDAY AT BEDTIME. NEED PRIMARY INSURANCE 90 tablet 3   predniSONE (DELTASONE) 20 MG tablet Take 2 tablets (40 mg total) by mouth daily with breakfast for 5 days. 10 tablet 0   venlafaxine XR (EFFEXOR-XR) 37.5 MG 24 hr capsule Take 1 capsule (37.5 mg total) by mouth daily. 90 capsule 3   No current facility-administered medications for this visit.     Musculoskeletal: Strength & Muscle Tone: within normal limits Gait & Station: normal, Telehealth Patient leans: N/A  Psychiatric Specialty Exam: Review of Systems  There were no vitals taken for this visit.There is no height or weight on file to calculate BMI.  General Appearance: Well Groomed  Eye Contact:  Good  Speech:  Clear and Coherent and Normal Rate  Volume:  Normal  Mood:  Euthymic  Affect:  Appropriate and Congruent  Thought Process:  Coherent, Goal Directed, and Linear  Orientation:  Full (Time, Place, and Person)  Thought Content: WDL and Logical   Suicidal Thoughts:  No  Homicidal Thoughts:  No  Memory:  Immediate;   Good Recent;   Good Remote;   Good  Judgement:  Good  Insight:  Good  Psychomotor Activity:  Normal  Concentration:  Concentration: Good and Attention Span: Good  Recall:  Good  Fund of Knowledge: Good  Language: Good  Akathisia:  No  Handed:  Right  AIMS (if indicated): not done  Assets:  Communication Skills Desire for Improvement Financial Resources/Insurance Housing Intimacy Physical Health Social  Support Transportation Vocational/Educational  ADL's:  Intact  Cognition: WNL  Sleep:  Good   Screenings: GAD-7    Flowsheet Row Video Visit from 11/10/2022 in [facility] Office Visit from 09/02/2022 in [facility] Video Visit from 08/30/2022 in [facility] Video Visit from 06/25/2022 in [facility] Video Visit from 03/26/2022 in [facility]  Total GAD-7 Score 11 10 9 19 13       PHQ2-9    Flowsheet Row Video Visit from 11/10/2022 in [facility] Office Visit from 09/02/2022 in [facility] Video Visit from 08/30/2022 in [facility] Video Visit from 06/25/2022 in [facility] Video Visit from  03/26/2022 in [facility]  PHQ-2 Total Score 1 2 3 3 2   PHQ-9 Total Score 5 7 4 10 3       Flowsheet Row Video Visit from 11/10/2022 in [facility] ED from 11/09/2022 in [facility] at [facility]) ED from 07/06/2022 in [facility] at [facility])  C-SSRS RISK CATEGORY No Risk No Risk No Risk        Assessment and Plan: Patient notes that she is doing well on her current medication regimen.  No medication changes made today.  Patient improved continue medication as prescribed   1. Generalized anxiety disorder  Continue- venlafaxine XR (EFFEXOR XR) 37.5 MG 24 hr capsule; Take 1 capsule (37.5 mg total) by mouth daily.  Dispense: 30 capsule; Refill: 3 Continue- hydrOXYzine (ATARAX) 50 MG tablet; Take 1 tablet (50 mg total) by mouth at bedtime.  Dispense: 60 tablet; Refill: 3  2. Mild depression  Continue- venlafaxine XR (EFFEXOR XR) 37.5 MG 24 hr capsule; Take 1 capsule (37.5 mg total) by mouth daily.  Dispense: 30 capsule; Refill:  3  Follow-up in 2.5 months   Shanna Cisco, NP 11/10/2022, 9:37 AM

## 2022-11-19 ENCOUNTER — Telehealth (INDEPENDENT_AMBULATORY_CARE_PROVIDER_SITE_OTHER): Payer: Self-pay | Admitting: Primary Care

## 2022-11-19 NOTE — Telephone Encounter (Signed)
Spoke to pt. Will be at apt.  

## 2022-11-22 ENCOUNTER — Ambulatory Visit (INDEPENDENT_AMBULATORY_CARE_PROVIDER_SITE_OTHER): Payer: Medicaid Other | Admitting: Primary Care

## 2022-11-29 ENCOUNTER — Ambulatory Visit (INDEPENDENT_AMBULATORY_CARE_PROVIDER_SITE_OTHER): Payer: Medicaid Other | Admitting: Primary Care

## 2022-12-02 ENCOUNTER — Ambulatory Visit (INDEPENDENT_AMBULATORY_CARE_PROVIDER_SITE_OTHER): Payer: Medicaid Other | Admitting: Primary Care

## 2022-12-07 ENCOUNTER — Ambulatory Visit (INDEPENDENT_AMBULATORY_CARE_PROVIDER_SITE_OTHER): Payer: Medicaid Other | Admitting: Primary Care

## 2022-12-14 ENCOUNTER — Ambulatory Visit (INDEPENDENT_AMBULATORY_CARE_PROVIDER_SITE_OTHER): Payer: Medicaid Other | Admitting: Primary Care

## 2022-12-14 ENCOUNTER — Other Ambulatory Visit (HOSPITAL_COMMUNITY): Payer: Self-pay | Admitting: Psychiatry

## 2022-12-14 VITALS — BP 125/85 | HR 78 | Resp 16 | Wt 213.4 lb

## 2022-12-14 DIAGNOSIS — R7303 Prediabetes: Secondary | ICD-10-CM

## 2022-12-14 DIAGNOSIS — Z1211 Encounter for screening for malignant neoplasm of colon: Secondary | ICD-10-CM

## 2022-12-14 DIAGNOSIS — Z114 Encounter for screening for human immunodeficiency virus [HIV]: Secondary | ICD-10-CM

## 2022-12-14 DIAGNOSIS — Z23 Encounter for immunization: Secondary | ICD-10-CM | POA: Diagnosis not present

## 2022-12-14 DIAGNOSIS — Z823 Family history of stroke: Secondary | ICD-10-CM

## 2022-12-14 DIAGNOSIS — E6609 Other obesity due to excess calories: Secondary | ICD-10-CM

## 2022-12-14 DIAGNOSIS — F411 Generalized anxiety disorder: Secondary | ICD-10-CM

## 2022-12-14 DIAGNOSIS — Z1231 Encounter for screening mammogram for malignant neoplasm of breast: Secondary | ICD-10-CM

## 2022-12-14 DIAGNOSIS — Z0000 Encounter for general adult medical examination without abnormal findings: Secondary | ICD-10-CM

## 2022-12-14 DIAGNOSIS — Z6836 Body mass index (BMI) 36.0-36.9, adult: Secondary | ICD-10-CM

## 2022-12-14 LAB — POCT GLYCOSYLATED HEMOGLOBIN (HGB A1C): HbA1c, POC (controlled diabetic range): 6.2 % (ref 0.0–7.0)

## 2022-12-14 NOTE — Progress Notes (Signed)
[facility]  Sheila Zhang is a 52 y.o. female presents to office today for annual physical exam examination.    Concerns today include: 1. Mother has stage IV colon cancer  2. Mother and paternal grand mother DM  Occupation: Financial risk analyst, Marital status: S partner female , Substance use: No Diet: No, Exercise: No  Health Maintenance  Topic Date Due   COVID-19 Vaccine (1) Never done   HIV Screening  Never done   Hepatitis C Screening  Never done   Zoster Vaccines- Shingrix (1 of 2) Never done   Colonoscopy  Never done   MAMMOGRAM  Never done   PAP SMEAR-Modifier  02/11/2024   DTaP/Tdap/Td (2 - Td or Tdap) 02/11/2031   INFLUENZA VACCINE  Completed   HPV VACCINES  Aged Out     Past Medical History:  Diagnosis Date   Anxiety    Dyslipidemia    Social History   Socioeconomic History   Marital status: Single    Spouse name: Not on file   Number of children: Not on file   Years of education: Not on file   Highest education level: Not on file  Occupational History   Not on file  Tobacco Use   Smoking status: Never   Smokeless tobacco: Never  Substance and Sexual Activity   Alcohol use: Never   Drug use: Never   Sexual activity: Not on file  Other Topics Concern   Not on file  Social History Narrative   Not on file   Social Determinants of Health   Financial Resource Strain: Not on file  Food Insecurity: Not on file  Transportation Needs: Not on file  Physical Activity: Not on file  Stress: Not on file  Social Connections: Not on file  Intimate Partner Violence: Not on file   Past Surgical History:  Procedure Laterality Date   tubaligation     Family History  Problem Relation Age of Onset   Colon cancer Neg Hx    Colon polyps Neg Hx    Esophageal cancer Neg Hx    Stomach cancer Neg Hx    Rectal cancer Neg Hx     Current Outpatient Medications:    atorvastatin (LIPITOR) 80 MG tablet, TAKE 1 TABLET BY MOUTH EVERY DAY, Disp: 90  tablet, Rfl: 0   hydrOXYzine (ATARAX) 50 MG tablet, TAKE 1 TABLET BY MOUTH EVERYDAY AT BEDTIME. NEED PRIMARY INSURANCE, Disp: 90 tablet, Rfl: 4   venlafaxine XR (EFFEXOR-XR) 37.5 MG 24 hr capsule, Take 1 capsule (37.5 mg total) by mouth daily., Disp: 90 capsule, Rfl: 3   cetirizine (ZYRTEC ALLERGY) 10 MG tablet, Take 1 tablet (10 mg total) by mouth at bedtime., Disp: 30 tablet, Rfl: 2 Outpatient Encounter Medications as of 12/14/2022  Medication Sig   atorvastatin (LIPITOR) 80 MG tablet TAKE 1 TABLET BY MOUTH EVERY DAY   hydrOXYzine (ATARAX) 50 MG tablet TAKE 1 TABLET BY MOUTH EVERYDAY AT BEDTIME. NEED PRIMARY INSURANCE   venlafaxine XR (EFFEXOR-XR) 37.5 MG 24 hr capsule Take 1 capsule (37.5 mg total) by mouth daily.   cetirizine (ZYRTEC ALLERGY) 10 MG tablet Take 1 tablet (10 mg total) by mouth at bedtime.   No facility-administered encounter medications on file as of 12/14/2022.    Allergies  Allergen Reactions   Escitalopram     Burning sensation      ROS: Review of Systems Pertinent items noted in HPI and remainder of comprehensive ROS otherwise negative.    Physical exam: General: Vital signs  reviewed.  Patient is well-developed and well-nourished, obese female in no acute distress and cooperative with exam. Head: Normocephalic and atraumatic. Eyes: EOMI, conjunctivae normal, no scleral icterus. Neck: Supple, trachea midline, normal ROM, no JVD, masses, thyromegaly, or carotid bruit present. Cardiovascular: RRR, S1 normal, S2 normal, no murmurs, gallops, or rubs. Pulmonary/Chest: Clear to auscultation bilaterally, no wheezes, rales, or rhonchi. Abdominal: Soft, non-tender, non-distended, BS +, no masses, organomegaly, or guarding present. Musculoskeletal: No joint deformities, erythema, or stiffness, ROM full and nontender. Extremities: No lower extremity edema bilaterally,  pulses symmetric and intact bilaterally. No cyanosis or clubbing. Neurological: A&O x3, Strength is  normal Skin: Warm, dry and intact. No rashes or erythema. Psychiatric: Normal mood and affect. speech and behavior is normal. Cognition and memory are normal.      Assessment/ Plan: Sheila Zhang here for annual physical exam.  Diagnoses and all orders for this visit:  Annual physical exam   Encounter for immunization -     Flu vaccine trivalent PF, 6mos and older(Flulaval,Afluria,Fluarix,Fluzone)  Colon cancer screening -     Ambulatory referral to Gastroenterology  Encounter for screening for HIV -     HIV Antibody (routine testing w rflx)  Breast cancer screening by mammogram -     MM DIGITAL SCREENING BILATERAL; Future  Family history of CVA Lipids / CMP  Prediabetes -     Hemoglobin A1c   Counseled on healthy lifestyle choices, including diet (rich in fruits, vegetables and lean meats and low in salt and simple carbohydrates) and exercise (at least 30 minutes of moderate physical activity daily).  Patient to follow up in 1 year for annual exam or sooner if needed.  The above assessment and management plan was discussed with the patient. The patient verbalized understanding of and has agreed to the management plan. Patient is aware to call the clinic if symptoms persist or worsen. Patient is aware when to return to the clinic for a follow-up visit. Patient educated on when it is appropriate to go to the emergency department.   This note has been created with Education officer, environmental. Any transcriptional errors are unintentional.   Grayce Sessions, NP 12/20/2022, 7:32 PM

## 2022-12-14 NOTE — Patient Instructions (Signed)
Calorie Counting for Weight Loss Calories are units of energy. Your body needs a certain number of calories from food to keep going throughout the day. When you eat or drink more calories than your body needs, your body stores the extra calories mostly as fat. When you eat or drink fewer calories than your body needs, your body burns fat to get the energy it needs. Calorie counting means keeping track of how many calories you eat and drink each day. Calorie counting can be helpful if you need to lose weight. If you eat fewer calories than your body needs, you should lose weight. Ask your health care provider what a healthy weight is for you. For calorie counting to work, you will need to eat the right number of calories each day to lose a healthy amount of weight per week. A dietitian can help you figure out how many calories you need in a day and will suggest ways to reach your calorie goal. A healthy amount of weight to lose each week is usually 1-2 lb (0.5-0.9 kg). This usually means that your daily calorie intake should be reduced by 500-750 calories. Eating 1,200-1,500 calories a day can help most women lose weight. Eating 1,500-1,800 calories a day can help most men lose weight. What do I need to know about calorie counting? Work with your health care provider or dietitian to determine how many calories you should get each day. To meet your daily calorie goal, you will need to: Find out how many calories are in each food that you would like to eat. Try to do this before you eat. Decide how much of the food you plan to eat. Keep a food log. Do this by writing down what you ate and how many calories it had. To successfully lose weight, it is important to balance calorie counting with a healthy lifestyle that includes regular activity. Where do I find calorie information?  The number of calories in a food can be found on a Nutrition Facts label. If a food does not have a Nutrition Facts label, try  to look up the calories online or ask your dietitian for help. Remember that calories are listed per serving. If you choose to have more than one serving of a food, you will have to multiply the calories per serving by the number of servings you plan to eat. For example, the label on a package of bread might say that a serving size is 1 slice and that there are 90 calories in a serving. If you eat 1 slice, you will have eaten 90 calories. If you eat 2 slices, you will have eaten 180 calories. How do I keep a food log? After each time that you eat, record the following in your food log as soon as possible: What you ate. Be sure to include toppings, sauces, and other extras on the food. How much you ate. This can be measured in cups, ounces, or number of items. How many calories were in each food and drink. The total number of calories in the food you ate. Keep your food log near you, such as in a pocket-sized notebook or on an app or website on your mobile phone. Some programs will calculate calories for you and show you how many calories you have left to meet your daily goal. What are some portion-control tips? Know how many calories are in a serving. This will help you know how many servings you can have of a certain   food. Use a measuring cup to measure serving sizes. You could also try weighing out portions on a kitchen scale. With time, you will be able to estimate serving sizes for some foods. Take time to put servings of different foods on your favorite plates or in your favorite bowls and cups so you know what a serving looks like. Try not to eat straight from a food's packaging, such as from a bag or box. Eating straight from the package makes it hard to see how much you are eating and can lead to overeating. Put the amount you would like to eat in a cup or on a plate to make sure you are eating the right portion. Use smaller plates, glasses, and bowls for smaller portions and to prevent  overeating. Try not to multitask. For example, avoid watching TV or using your computer while eating. If it is time to eat, sit down at a table and enjoy your food. This will help you recognize when you are full. It will also help you be more mindful of what and how much you are eating. What are tips for following this plan? Reading food labels Check the calorie count compared with the serving size. The serving size may be smaller than what you are used to eating. Check the source of the calories. Try to choose foods that are high in protein, fiber, and vitamins, and low in saturated fat, trans fat, and sodium. Shopping Read nutrition labels while you shop. This will help you make healthy decisions about which foods to buy. Pay attention to nutrition labels for low-fat or fat-free foods. These foods sometimes have the same number of calories or more calories than the full-fat versions. They also often have added sugar, starch, or salt to make up for flavor that was removed with the fat. Make a grocery list of lower-calorie foods and stick to it. Cooking Try to cook your favorite foods in a healthier way. For example, try baking instead of frying. Use low-fat dairy products. Meal planning Use more fruits and vegetables. One-half of your plate should be fruits and vegetables. Include lean proteins, such as chicken, turkey, and fish. Lifestyle Each week, aim to do one of the following: 150 minutes of moderate exercise, such as walking. 75 minutes of vigorous exercise, such as running. General information Know how many calories are in the foods you eat most often. This will help you calculate calorie counts faster. Find a way of tracking calories that works for you. Get creative. Try different apps or programs if writing down calories does not work for you. What foods should I eat?  Eat nutritious foods. It is better to have a nutritious, high-calorie food, such as an avocado, than a food with  few nutrients, such as a bag of potato chips. Use your calories on foods and drinks that will fill you up and will not leave you hungry soon after eating. Examples of foods that fill you up are nuts and nut butters, vegetables, lean proteins, and high-fiber foods such as whole grains. High-fiber foods are foods with more than 5 g of fiber per serving. Pay attention to calories in drinks. Low-calorie drinks include water and unsweetened drinks. The items listed above may not be a complete list of foods and beverages you can eat. Contact a dietitian for more information. What foods should I limit? Limit foods or drinks that are not good sources of vitamins, minerals, or protein or that are high in unhealthy fats. These   include: Candy. Other sweets. Sodas, specialty coffee drinks, alcohol, and juice. The items listed above may not be a complete list of foods and beverages you should avoid. Contact a dietitian for more information. How do I count calories when eating out? Pay attention to portions. Often, portions are much larger when eating out. Try these tips to keep portions smaller: Consider sharing a meal instead of getting your own. If you get your own meal, eat only half of it. Before you start eating, ask for a container and put half of your meal into it. When available, consider ordering smaller portions from the menu instead of full portions. Pay attention to your food and drink choices. Knowing the way food is cooked and what is included with the meal can help you eat fewer calories. If calories are listed on the menu, choose the lower-calorie options. Choose dishes that include vegetables, fruits, whole grains, low-fat dairy products, and lean proteins. Choose items that are boiled, broiled, grilled, or steamed. Avoid items that are buttered, battered, fried, or served with cream sauce. Items labeled as crispy are usually fried, unless stated otherwise. Choose water, low-fat milk,  unsweetened iced tea, or other drinks without added sugar. If you want an alcoholic beverage, choose a lower-calorie option, such as a glass of wine or light beer. Ask for dressings, sauces, and syrups on the side. These are usually high in calories, so you should limit the amount you eat. If you want a salad, choose a garden salad and ask for grilled meats. Avoid extra toppings such as bacon, cheese, or fried items. Ask for the dressing on the side, or ask for olive oil and vinegar or lemon to use as dressing. Estimate how many servings of a food you are given. Knowing serving sizes will help you be aware of how much food you are eating at restaurants. Where to find more information Centers for Disease Control and Prevention: www.cdc.gov [place] Department of Agriculture: myplate.gov Summary Calorie counting means keeping track of how many calories you eat and drink each day. If you eat fewer calories than your body needs, you should lose weight. A healthy amount of weight to lose per week is usually 1-2 lb (0.5-0.9 kg). This usually means reducing your daily calorie intake by 500-750 calories. The number of calories in a food can be found on a Nutrition Facts label. If a food does not have a Nutrition Facts label, try to look up the calories online or ask your dietitian for help. Use smaller plates, glasses, and bowls for smaller portions and to prevent overeating. Use your calories on foods and drinks that will fill you up and not leave you hungry shortly after a meal. This information is not intended to replace advice given to you by your health care provider. Make sure you discuss any questions you have with your health care provider. Document Revised: 05/10/2019 Document Reviewed: 05/10/2019 Elsevier Patient Education  2023 [employer]  

## 2022-12-20 ENCOUNTER — Other Ambulatory Visit (INDEPENDENT_AMBULATORY_CARE_PROVIDER_SITE_OTHER): Payer: Medicaid Other

## 2022-12-20 ENCOUNTER — Encounter (INDEPENDENT_AMBULATORY_CARE_PROVIDER_SITE_OTHER): Payer: Self-pay | Admitting: Primary Care

## 2022-12-21 ENCOUNTER — Other Ambulatory Visit (INDEPENDENT_AMBULATORY_CARE_PROVIDER_SITE_OTHER): Payer: Medicaid Other

## 2023-02-02 ENCOUNTER — Telehealth (INDEPENDENT_AMBULATORY_CARE_PROVIDER_SITE_OTHER): Payer: Medicaid Other | Admitting: Psychiatry

## 2023-02-02 ENCOUNTER — Encounter (HOSPITAL_COMMUNITY): Payer: Self-pay | Admitting: Psychiatry

## 2023-02-02 DIAGNOSIS — F32A Depression, unspecified: Secondary | ICD-10-CM | POA: Diagnosis not present

## 2023-02-02 DIAGNOSIS — F411 Generalized anxiety disorder: Secondary | ICD-10-CM

## 2023-02-02 MED ORDER — VENLAFAXINE HCL ER 37.5 MG PO CP24
37.5000 mg | ORAL_CAPSULE | Freq: Every day | ORAL | 3 refills | Status: DC
Start: 2023-02-02 — End: 2023-05-06

## 2023-02-02 MED ORDER — HYDROXYZINE HCL 50 MG PO TABS
ORAL_TABLET | ORAL | 4 refills | Status: DC
Start: 2023-02-02 — End: 2023-05-06

## 2023-02-02 NOTE — Progress Notes (Signed)
BH MD/PA/NP OP Progress Note Virtual Visit via Video Note  I connected with Sheila Zhang on 02/02/23 at  9:00 AM EDT by a video enabled telemedicine application and verified that I am speaking with the correct person using two identifiers.  Location: Patient: Work Provider: Clinic   I discussed the limitations of evaluation and management by telemedicine and the availability of in person appointments. The patient expressed understanding and agreed to proceed.  I provided 30 minutes of non-face-to-face time during this encounter.    02/02/2023 8:50 AM Sheila Zhang  MRN:  [id number]  Chief Complaint: "I have been dealing a lot with my mother"  HPI: 52 year old female seen today for follow-up psychiatric evaluation.  She has a psychiatric history of anxiety.  She is currently being managed on Effexor 37.5 mg and hydroxyzine 50 mg nightly.  She notes her medications are effective in managing her psychiatric condition.  Today patient was pleasant, cooperative, and engaged in conversation.  She informed Clinical research associate that she is getting better.  She notes that she has been dealing a lot with her mother who has cancer. She notes that she has been taking her to doctors appointment. She informed Clinical research associate that she worries about her mother at times because she reports that she can be stubborn and has missed some of her appointments. She also notes that she is worried about her children.   Mentally patient notes that she is doing well despite the above. Today provider conducted GAD-7 and patient scored an 9, at her last visit she scored a 11. Provider also conducted PHQ-9 of he scored 3, at her last visit she scored a 4.  She endorses adequate sleep and appetite.   Today she denies SI/HI/AVH, mania, paranoia.    No medication changes made today.  Patient agreeable to continue medication as prescribed.  No other concerns noted at this time.  Visit Diagnosis:  No diagnosis found.     Past  Psychiatric History: Anxiety  Past Medical History:  Past Medical History:  Diagnosis Date   Anxiety    Dyslipidemia     Past Surgical History:  Procedure Laterality Date   tubaligation      Family Psychiatric History: Mother: anxiety, suicidal ideations, depression. Sister: anxiety.  Family History:  Family History  Problem Relation Age of Onset   Colon cancer Neg Hx    Colon polyps Neg Hx    Esophageal cancer Neg Hx    Stomach cancer Neg Hx    Rectal cancer Neg Hx     Social History:  Social History   Socioeconomic History   Marital status: Single    Spouse name: Not on file   Number of children: Not on file   Years of education: Not on file   Highest education level: Not on file  Occupational History   Not on file  Tobacco Use   Smoking status: Never   Smokeless tobacco: Never  Substance and Sexual Activity   Alcohol use: Never   Drug use: Never   Sexual activity: Not on file  Other Topics Concern   Not on file  Social History Narrative   Not on file   Social Determinants of Health   Financial Resource Strain: Not on file  Food Insecurity: Not on file  Transportation Needs: Not on file  Physical Activity: Not on file  Stress: Not on file  Social Connections: Not on file    Allergies:  Allergies  Allergen Reactions   Escitalopram  Burning sensation     Metabolic Disorder Labs: Lab Results  Component Value Date   HGBA1C 6.2 12/14/2022   No results found for: "PROLACTIN" Lab Results  Component Value Date   CHOL 241 (H) 01/05/2021   TRIG 264 (H) 01/05/2021   HDL 57 01/05/2021   CHOLHDL 4.2 01/05/2021   LDLCALC 137 (H) 01/05/2021   Lab Results  Component Value Date   TSH 1.030 01/05/2021    Therapeutic Level Labs: No results found for: "LITHIUM" No results found for: "VALPROATE" No results found for: "CBMZ"  Current Medications: Current Outpatient Medications  Medication Sig Dispense Refill   atorvastatin (LIPITOR) 80 MG  tablet TAKE 1 TABLET BY MOUTH EVERY DAY 90 tablet 0   cetirizine (ZYRTEC ALLERGY) 10 MG tablet Take 1 tablet (10 mg total) by mouth at bedtime. 30 tablet 2   hydrOXYzine (ATARAX) 50 MG tablet TAKE 1 TABLET BY MOUTH EVERYDAY AT BEDTIME. NEED PRIMARY INSURANCE 90 tablet 4   venlafaxine XR (EFFEXOR-XR) 37.5 MG 24 hr capsule Take 1 capsule (37.5 mg total) by mouth daily. 90 capsule 3   No current facility-administered medications for this visit.     Musculoskeletal: Strength & Muscle Tone: within normal limits Gait & Station: normal, Telehealth Patient leans: N/A  Psychiatric Specialty Exam: Review of Systems  There were no vitals taken for this visit.There is no height or weight on file to calculate BMI.  General Appearance: Well Groomed  Eye Contact:  Good  Speech:  Clear and Coherent and Normal Rate  Volume:  Normal  Mood:  Euthymic  Affect:  Appropriate and Congruent  Thought Process:  Coherent, Goal Directed, and Linear  Orientation:  Full (Time, Place, and Person)  Thought Content: WDL and Logical   Suicidal Thoughts:  No  Homicidal Thoughts:  No  Memory:  Immediate;   Good Recent;   Good Remote;   Good  Judgement:  Good  Insight:  Good  Psychomotor Activity:  Normal  Concentration:  Concentration: Good and Attention Span: Good  Recall:  Good  Fund of Knowledge: Good  Language: Good  Akathisia:  No  Handed:  Right  AIMS (if indicated): not done  Assets:  Communication Skills Desire for Improvement Financial Resources/Insurance Housing Intimacy Physical Health Social Support Transportation Vocational/Educational  ADL's:  Intact  Cognition: WNL  Sleep:  Good   Screenings: GAD-7    Flowsheet Row Office Visit from 12/14/2022 in [facility] Video Visit from 11/10/2022 in [facility] Office Visit from 09/02/2022 in [facility] Video Visit from 08/30/2022 in [facility] Video Visit from 06/25/2022 in [facility]  Total GAD-7 Score 6 11 10 9 19       PHQ2-9    Flowsheet Row Office Visit from 12/14/2022 in [facility] Video Visit from 11/10/2022 in [facility] Office Visit from 09/02/2022 in [facility] Video Visit from 08/30/2022 in [facility] Video Visit from 06/25/2022 in [facility]  PHQ-2 Total Score 2 1 2 3 3   PHQ-9 Total Score 6 5 7 4 10       Flowsheet Row Video Visit from 11/10/2022 in [facility] ED from 11[facility]09/2022 in [facility] at [facility]) ED from 07/06/2022 in [facility] at [facility])  C-SSRS RISK CATEGORY No Risk No Risk No  Risk        Assessment and Plan: Patient notes that she is doing well on her current medication regimen.  No medication changes made today.  Patient improved continue medication as prescribed   1. Generalized anxiety disorder  Continue- venlafaxine XR (EFFEXOR XR) 37.5 MG 24 hr capsule; Take 1 capsule (37.5 mg total) by mouth daily.  Dispense: 30 capsule; Refill: 3 Continue- hydrOXYzine (ATARAX) 50 MG tablet; Take 1 tablet (50 mg total) by mouth at bedtime.  Dispense: 60 tablet; Refill: 3  2. Mild depression  Continue- venlafaxine XR (EFFEXOR XR) 37.5 MG 24 hr capsule; Take 1 capsule (37.5 mg total) by mouth daily.  Dispense: 30 capsule; Refill: 3  Follow-up in 2.5 months   Shanna Cisco, NP 02/02/2023, 8:50 AM

## 2023-02-04 ENCOUNTER — Ambulatory Visit: Payer: Medicaid Other

## 2023-02-07 ENCOUNTER — Other Ambulatory Visit (INDEPENDENT_AMBULATORY_CARE_PROVIDER_SITE_OTHER): Payer: Self-pay | Admitting: Primary Care

## 2023-02-07 DIAGNOSIS — E782 Mixed hyperlipidemia: Secondary | ICD-10-CM

## 2023-02-08 NOTE — Telephone Encounter (Signed)
Requested medication (s) are due for refill today:   Yes  Requested medication (s) are on the active medication list:   Yes  Future visit scheduled:   No   Just had physical 12/14/2022 but never had her labs drawn.  Last labs are from 2022.   Last ordered: 12/31/2021  #90, 0 refills.    Unable to refill because labs are overdue per protocol.     Requested Prescriptions  Pending Prescriptions Disp Refills   atorvastatin (LIPITOR) 80 MG tablet [Pharmacy Med Name: ATORVASTATIN 80 MG TABLET] 90 tablet 0    Sig: TAKE 1 TABLET BY MOUTH EVERY DAY     Cardiovascular:  Antilipid - Statins Failed - 02/07/2023  3:03 PM      Failed - Lipid Panel in normal range within the last 12 months    Cholesterol, Total  Date Value Ref Range Status  01/05/2021 241 (H) 100 - 199 mg/dL Final   LDL Chol Calc (NIH)  Date Value Ref Range Status  01/05/2021 137 (H) 0 - 99 mg/dL Final   HDL  Date Value Ref Range Status  01/05/2021 57 >39 mg/dL Final   Triglycerides  Date Value Ref Range Status  01/05/2021 264 (H) 0 - 149 mg/dL Final         Passed - Patient is not pregnant      Passed - Valid encounter within last 12 months    Recent Outpatient Visits           1 month ago Annual physical exam   [place] [facility] Grayce Sessions, NP   5 months ago Colon cancer screening   [place] [facility] Grayce Sessions, NP   1 year ago Hospital discharge follow-up   [place] [facility] Grayce Sessions, NP   1 year ago Need for Tdap vaccination   [place] [facility] Grayce Sessions, NP   2 years ago Hospital discharge follow-up   [place] [facility] Grayce Sessions, NP       Future Appointments             In 4 months Randa Evens, Kinnie Scales, NP  [facility]

## 2023-02-14 ENCOUNTER — Ambulatory Visit (AMBULATORY_SURGERY_CENTER): Payer: Medicaid Other

## 2023-02-14 VITALS — Ht 64.0 in | Wt 215.0 lb

## 2023-02-14 DIAGNOSIS — Z1211 Encounter for screening for malignant neoplasm of colon: Secondary | ICD-10-CM

## 2023-02-14 MED ORDER — PEG 3350-KCL-NA BICARB-NACL 420 G PO SOLR
4000.0000 mL | Freq: Once | ORAL | 0 refills | Status: AC
Start: 2023-02-14 — End: 2023-02-14

## 2023-02-14 NOTE — Telephone Encounter (Signed)
Will forward to provider  No cholesterol level in 2 years

## 2023-02-14 NOTE — Progress Notes (Signed)
No egg or soy allergy known to patient  No issues known to pt with past sedation with any surgeries or procedures Patient denies ever being told they had issues or difficulty with intubation  No FH of Malignant Hyperthermia Pt is not on diet pills Pt is not on  home 02  Pt is not on blood thinners  Pt denies issues with constipation  No A fib or A flutter Have any cardiac testing pending--no  LOA: independent  Prep: Golytely, suprep offered pt prefers to go with prep insurance will cover   Patient's chart reviewed by Cathlyn Parsons CNRA prior to previsit and patient appropriate for the [facility].  Previsit completed and red dot placed by patient's name on their procedure day (on provider's schedule).     PV competed with patient. Prep instructions sent via mychart and home address.

## 2023-02-18 ENCOUNTER — Ambulatory Visit: Payer: Medicaid Other

## 2023-02-18 ENCOUNTER — Ambulatory Visit
Admission: EM | Admit: 2023-02-18 | Discharge: 2023-02-18 | Disposition: A | Discharge status: Home / Self Care | Payer: Medicaid Other

## 2023-02-18 DIAGNOSIS — R053 Chronic cough: Secondary | ICD-10-CM

## 2023-02-18 DIAGNOSIS — R059 Cough, unspecified: Secondary | ICD-10-CM

## 2023-02-18 DIAGNOSIS — J069 Acute upper respiratory infection, unspecified: Secondary | ICD-10-CM

## 2023-02-18 MED ORDER — BENZONATATE 100 MG PO CAPS: 100.0000 mg | ORAL_CAPSULE | Freq: Three times a day (TID) | ORAL | 0 refills | Status: DC | PRN

## 2023-02-18 MED ORDER — AMOXICILLIN-POT CLAVULANATE 875-125 MG PO TABS: 1.0000 | ORAL_TABLET | Freq: Two times a day (BID) | ORAL | 0 refills | Status: DC

## 2023-02-18 MED ORDER — PREDNISONE 20 MG PO TABS: 40.0000 mg | ORAL_TABLET | Freq: Every day | ORAL | 0 refills | Status: AC

## 2023-02-18 NOTE — ED Provider Notes (Signed)
[facility]    CSN: [id number] Arrival date & time: 02/18/23  1207      History   Chief Complaint Chief Complaint  Patient presents with   Cough   Sore Throat    HPI Sheila Zhang is a 52 y.o. female.   Patient presents with 2-week history of nasal congestion and coughing.  Reports cough is causing a sore throat.  Cough is dry.  Denies history of asthma or COPD and patient denies that she smokes cigarettes.  Denies chest pain or shortness of breath.  She has taken over-the-counter cold and flu medications with minimal symptoms.  She had fever yesterday with Tmax of 100.8.   Cough Sore Throat    Past Medical History:  Diagnosis Date   Anxiety    Dyslipidemia     Patient Active Problem List   Diagnosis Date Noted   Lateral epicondylitis, left elbow 05/12/2021   Generalized anxiety disorder 03/14/2021    Past Surgical History:  Procedure Laterality Date   tubaligation      OB History   No obstetric history on file.      Home Medications    Prior to Admission medications   Medication Sig Start Date End Date Taking? Authorizing Provider  amoxicillin-clavulanate (AUGMENTIN) 875-125 MG tablet Take 1 tablet by mouth every 12 (twelve) hours. 02/18/23  Yes Nirali Magouirk, Rolly Salter E, FNP  atorvastatin (LIPITOR) 80 MG tablet TAKE 1 TABLET BY MOUTH EVERY DAY 12/31/21  Yes Grayce Sessions, NP  benzonatate (TESSALON) 100 MG capsule Take 1 capsule (100 mg total) by mouth every 8 (eight) hours as needed for cough. 02/18/23  Yes Veleda Mun, Acie Fredrickson, FNP  cetirizine (ZYRTEC ALLERGY) 10 MG tablet Take 1 tablet (10 mg total) by mouth at bedtime. 09/06/21 02/18/23 Yes Theadora Rama Scales, PA-C  hydrOXYzine (ATARAX) 50 MG tablet TAKE 1 TABLET BY MOUTH EVERYDAY AT BEDTIME. NEED PRIMARY INSURANCE 02/02/23  Yes Toy Cookey E, NP  polyethylene glycol-electrolytes (NULYTELY) 420 g solution Take 4,000 mLs by mouth once. 02/14/23  Yes [provider]  predniSONE  (DELTASONE) 20 MG tablet Take 2 tablets (40 mg total) by mouth daily for 5 days. 02/18/23 02/23/23 Yes Randell Detter, Acie Fredrickson, FNP  venlafaxine XR (EFFEXOR-XR) 37.5 MG 24 hr capsule Take 1 capsule (37.5 mg total) by mouth daily. 02/02/23  Yes Shanna Cisco, NP    Family History Family History  Problem Relation Age of Onset   Rectal cancer Mother 31       stage 4 on chemo   Cervical cancer Mother 35   Colon cancer Neg Hx    Colon polyps Neg Hx    Esophageal cancer Neg Hx    Stomach cancer Neg Hx     Social History Social History   Tobacco Use   Smoking status: Never   Smokeless tobacco: Never  Vaping Use   Vaping status: Never Used  Substance Use Topics   Alcohol use: Never   Drug use: Never     Allergies   Escitalopram   Review of Systems Review of Systems Per HPI  Physical Exam Triage Vital Signs ED Triage Vitals  Encounter Vitals Group     BP 02/18/23 1325 116/75     Systolic BP Percentile --      Diastolic BP Percentile --      Pulse Rate 02/18/23 1325 79     Resp 02/18/23 1325 18     Temp 02/18/23 1325 98.1 F (36.7 C)  Temp Source 02/18/23 1325 Oral     SpO2 02/18/23 1325 97 %     Weight 02/18/23 1323 215 lb (97.5 kg)     Height 02/18/23 1323 5\' 4"  (1.626 m)     Head Circumference --      Peak Flow --      Pain Score 02/18/23 1321 0     Pain Loc --      Pain Education --      Exclude from Growth Chart --    No data found.  Updated Vital Signs BP 116/75 (BP Location: Right Arm)   Pulse 79   Temp 98.1 F (36.7 C) (Oral)   Resp 18   Ht 5\' 4"  (1.626 m)   Wt 215 lb (97.5 kg)   LMP 01/24/2023 (Approximate)   SpO2 97%   BMI 36.90 kg/m   Visual Acuity Right Eye Distance:   Left Eye Distance:   Bilateral Distance:    Right Eye Near:   Left Eye Near:    Bilateral Near:     Physical Exam Constitutional:      General: She is not in acute distress.    Appearance: Normal appearance. She is not toxic-appearing or diaphoretic.  HENT:      Head: Normocephalic and atraumatic.     Right Ear: Tympanic membrane and ear canal normal.     Left Ear: Tympanic membrane and ear canal normal.     Nose: Congestion present.     Mouth/Throat:     Mouth: Mucous membranes are moist.     Pharynx: Posterior oropharyngeal erythema present.  Eyes:     Extraocular Movements: Extraocular movements intact.     Conjunctiva/sclera: Conjunctivae normal.     Pupils: Pupils are equal, round, and reactive to light.  Cardiovascular:     Rate and Rhythm: Normal rate and regular rhythm.     Pulses: Normal pulses.     Heart sounds: Normal heart sounds.  Pulmonary:     Effort: Pulmonary effort is normal. No respiratory distress.     Breath sounds: Normal breath sounds. No stridor. No wheezing, rhonchi or rales.  Abdominal:     General: Abdomen is flat. Bowel sounds are normal.     Palpations: Abdomen is soft.  Musculoskeletal:        General: Normal range of motion.     Cervical back: Normal range of motion.  Skin:    General: Skin is warm and dry.  Neurological:     General: No focal deficit present.     Mental Status: She is alert and oriented to person, place, and time. Mental status is at baseline.  Psychiatric:        Mood and Affect: Mood normal.        Behavior: Behavior normal.      UC Treatments / Results  Labs (all labs ordered are listed, but only abnormal results are displayed) Labs Reviewed - No data to display  EKG   Radiology DG Chest 2 View  Result Date: 02/18/2023 CLINICAL DATA:  Cough EXAM: CHEST - 2 VIEW COMPARISON:  Chest x-ray dated March 24, 2021 FINDINGS: The heart size and mediastinal contours are within normal limits. Both lungs are clear. The visualized skeletal structures are unremarkable. IMPRESSION: No active cardiopulmonary disease. Electronically Signed   By: Allegra Lai M.D.   On: 02/18/2023 16:54    Procedures Procedures (including critical care time)  Medications Ordered in UC Medications -  No data to display  Initial  Impression / Assessment and Plan / UC Course  I have reviewed the triage vital signs and the nursing notes.  Pertinent labs & imaging results that were available during my care of the patient were reviewed by me and considered in my medical decision making (see chart for details).     Chest x-ray completed that was negative for any acute cardiopulmonary process.  Suspect acute bronchitis versus acute sinus infection.  Will treat with Augmentin, prednisone, benzonatate for cough.  Advised supportive care and symptom management.  Advised strict follow-up with precautions.  Patient verbalized understanding and was agreeable with plan. Final Clinical Impressions(s) / UC Diagnoses   Final diagnoses:  None     Discharge Instructions      Will call if x-ray is abnormal.  I have prescribed 3 different medications to help with symptoms.  Follow-up if any symptoms persist or worsen.     ED Prescriptions     Medication Sig Dispense Auth. Provider   amoxicillin-clavulanate (AUGMENTIN) 875-125 MG tablet Take 1 tablet by mouth every 12 (twelve) hours. 14 tablet [place], [place], [place]   benzonatate (TESSALON) 100 MG capsule Take 1 capsule (100 mg total) by mouth every 8 (eight) hours as needed for cough. 21 capsule [place], [place], [place]   predniSONE (DELTASONE) 20 MG tablet Take 2 tablets (40 mg total) by mouth daily for 5 days. 10 tablet Gust[place]us Bryant, [place]      PDMP not reviewed this encounter.   Gustavus Bryant, [place] 02/18/23 1704

## 2023-02-18 NOTE — ED Triage Notes (Signed)
"  I have been having a cough for over 2 wks and it is causing me a sore throat". "I thought it was allergies so I treated it as that and nothing helped". No fever. No runny nose.

## 2023-02-18 NOTE — Discharge Instructions (Signed)
Will call if x-ray is abnormal.  I have prescribed 3 different medications to help with symptoms.  Follow-up if any symptoms persist or worsen.

## 2023-02-28 ENCOUNTER — Telehealth: Payer: Self-pay | Admitting: Gastroenterology

## 2023-02-28 NOTE — Telephone Encounter (Signed)
RN called patient to provide clarity on taking 4 Dulcolax tablets on THURSDAY, 11/21. Patient reviewed prep letter as RN provided reminders of the preparation process. Patient stated understanding.

## 2023-02-28 NOTE — Telephone Encounter (Signed)
Inbound call from patient stating she thought she started to take dulcolax medication on Sunday 11/17. Advised patient she would start Thursday 11/21. Patient requesting a call to discuss further and to be advised on proceeding with 11/22 procedure. Please advise, thank you.

## 2023-03-01 ENCOUNTER — Other Ambulatory Visit (INDEPENDENT_AMBULATORY_CARE_PROVIDER_SITE_OTHER): Payer: Self-pay | Admitting: Primary Care

## 2023-03-01 DIAGNOSIS — E782 Mixed hyperlipidemia: Secondary | ICD-10-CM

## 2023-03-01 NOTE — Telephone Encounter (Unsigned)
Copied from CRM [id number]. Topic: General - Other >> Mar 01, 2023 11:09 AM Everette C wrote: Reason for CRM: Medication Refill - Most Recent Primary Care Visit:  Provider: Grayce Sessions Department: [facility] Visit Type: OFFICE VISIT Date: 12/14/2022  Medication: cetirizine (ZYRTEC ALLERGY) 10 MG tablet [[id number]]  ENDED  atorvastatin (LIPITOR) 80 MG tablet [[id number]]  fluticasone (FLONASE) 50 MCG/ACT nasal spray [[id number]]  DISCONTINUED    Has the patient contacted their pharmacy? Yes (Agent: If no, request that the patient contact the pharmacy for the refill. If patient does not wish to contact the pharmacy document the reason why and proceed with request.) (Agent: If yes, when and what did the pharmacy advise?)  Is this the correct pharmacy for this prescription? Yes If no, delete pharmacy and type the correct one.  This is the patient's preferred pharmacy:  [facility] [address] [place] - [address] [place] [address] Phone: [phone] Fax: [phone]   Has the prescription been filled recently? Yes  Is the patient out of the medication? Yes  Has the patient been seen for an appointment in the last year OR does the patient have an upcoming appointment? Yes  Can we respond through MyChart? No  Agent: Please be advised that Rx refills may take up to 3 business days. We ask that you follow-up with your pharmacy.

## 2023-03-02 ENCOUNTER — Ambulatory Visit: Payer: Medicaid Other

## 2023-03-02 NOTE — Telephone Encounter (Signed)
Requested medication (s) are due for refill today: Yes  Requested medication (s) are on the active medication list: Yes (No Flonase)  Last refill:  09/06/21 and 01/01/23  Future visit scheduled: Yes  Notes to clinic:  Unable to refill per protocol due to failed labs, no updated results. Unable to refill per protocol, last refill by another provider.      Requested Prescriptions  Pending Prescriptions Disp Refills   cetirizine (ZYRTEC ALLERGY) 10 MG tablet 30 tablet 2    Sig: Take 1 tablet (10 mg total) by mouth at bedtime.     Ear, Nose, and Throat:  Antihistamines 2 Failed - 03/01/2023 11:30 AM      Failed - Cr in normal range and within 360 days    Creatinine, Ser  Date Value Ref Range Status  01/05/2021 0.74 0.57 - 1.00 mg/dL Final         Passed - Valid encounter within last 12 months    Recent Outpatient Visits           2 months ago Annual physical exam   [place] [facility] Grayce Sessions, NP   6 months ago Colon cancer screening   [place] [facility] Grayce Sessions, NP   1 year ago Hospital discharge follow-up   [place] [facility] Grayce Sessions, NP   2 years ago Need for Tdap vaccination   [place] [facility] Grayce Sessions, NP   2 years ago Hospital discharge follow-up   [place] [facility] Grayce Sessions, NP       Future Appointments             In 3 months Grayce Sessions, NP [place] [facility]             atorvastatin (LIPITOR) 80 MG tablet 90 tablet 0    Sig: Take 1 tablet (80 mg total) by mouth daily.     Cardiovascular:  Antilipid - Statins Failed - 03/01/2023 11:30 AM      Failed - Lipid Panel in normal range within the last 12 months    Cholesterol, Total  Date Value Ref Range Status  01/05/2021 241 (H) 100 - 199 mg/dL Final   LDL Chol Calc (NIH)  Date Value Ref Range Status  01/05/2021  137 (H) 0 - 99 mg/dL Final   HDL  Date Value Ref Range Status  01/05/2021 57 >39 mg/dL Final   Triglycerides  Date Value Ref Range Status  01/05/2021 264 (H) 0 - 149 mg/dL Final         Passed - Patient is not pregnant      Passed - Valid encounter within last 12 months    Recent Outpatient Visits           2 months ago Annual physical exam   [place] [facility] Grayce Sessions, NP   6 months ago Colon cancer screening   [place] [facility] Grayce Sessions, NP   1 year ago Hospital discharge follow-up   [place] [facility] Grayce Sessions, NP   2 years ago Need for Tdap vaccination   [place] [facility] Grayce Sessions, NP   2 years ago Hospital discharge follow-up   [place] [facility] Grayce Sessions, NP       Future Appointments  In 3 months Grayce Sessions, NP [place] [facility]             fluticasone (FLONASE) 50 MCG/ACT nasal spray 32 mL 1    Sig: Place 1 spray into both nostrils daily. Begin by using 2 sprays in each nare daily for 3 to 5 days, then decrease to 1 spray in each nare daily.     Ear, Nose, and Throat: Nasal Preparations - Corticosteroids Passed - 03/01/2023 11:30 AM      Passed - Valid encounter within last 12 months    Recent Outpatient Visits           2 months ago Annual physical exam   [place] [facility] Grayce Sessions, NP   6 months ago Colon cancer screening   [place] [facility] Grayce Sessions, NP   1 year ago Hospital discharge follow-up   [place] [facility] Grayce Sessions, NP   2 years ago Need for Tdap vaccination   [place] [facility] Grayce Sessions, NP   2 years ago Hospital discharge follow-up   [place] [facility] Grayce Sessions, NP        Future Appointments             In 3 months Randa Evens, Kinnie Scales, NP [place] [facility]

## 2023-03-03 ENCOUNTER — Ambulatory Visit (INDEPENDENT_AMBULATORY_CARE_PROVIDER_SITE_OTHER): Payer: Self-pay

## 2023-03-03 NOTE — Telephone Encounter (Signed)
  Chief Complaint: Side effects from coloscopy prep Symptoms: Vomiting, dizziness Frequency: since 5pm Pertinent Negatives: Patient denies  Disposition: [] ED /[] Urgent Care (no appt availability in office) / [] Appointment(In office/virtual)/ []  [place] [facility]/ [] Home Care/ [] Refused Recommended Disposition /[] [place] [facility]/ [x]  Follow-up with PCP Additional Notes: Pt started prep for colonoscopy. She took 4 ducolax at 3 pm. And drank the first bottle at 5pm. Pt states the she feels terrible. She has vomited up the bottle of laxative. She also has expected diarrhea. She will not be drinking the second bottle. Pt will send a MYChart message to colonoscopy provider to further advise regarding proceeding with colonoscopy. Pt will continue to monitor s/s. If vomiting continues or contains blood, or if dizziness returns pt will call back or seek immediate care.   Summary: Colonoscopy Prep making pt sick   Pt is calling in because she is doing the colonoscopy prep and she cannot finish it. Pt says the liquid is causing her to vomit. Pt says she also feels dizzy from it and says she cannot finish the prep because of the side effects. Pt wants to know what she should do. Please advise.     Reason for Disposition  [1] Caller has NON-URGENT medicine question about med that PCP prescribed AND [2] triager unable to answer question  Answer Assessment - Initial Assessment Questions 1. NAME of MEDICINE: "What medicine(s) are you calling about?"     Prep for colonoscopy 2. QUESTION: "What is your question?" (e.g., double dose of medicine, side effect)     Causing terrible side effects  4. SYMPTOMS: "Do you have any symptoms?" If Yes, ask: "What symptoms are you having?"  "How bad are the symptoms (e.g., mild, moderate, severe)     Vomiting -  Protocols used: Medication Question Call-A-AH

## 2023-03-04 ENCOUNTER — Telehealth: Payer: Self-pay | Admitting: *Deleted

## 2023-03-04 ENCOUNTER — Encounter: Payer: Medicaid Other | Admitting: Gastroenterology

## 2023-03-04 DIAGNOSIS — Z1211 Encounter for screening for malignant neoplasm of colon: Secondary | ICD-10-CM

## 2023-03-04 MED ORDER — ONDANSETRON HCL 4 MG PO TABS: 4.0000 mg | ORAL_TABLET | Freq: Three times a day (TID) | ORAL | 0 refills | Status: DC | PRN

## 2023-03-04 NOTE — Telephone Encounter (Signed)
Pt called triage nurse line overnight. She was unable to tolerate the prep. Had vomiting after drinking the first 8oz glass of Golytely and was unable to to drink anymore. Bowels are not prepped adequately. Procedure rescheduled and prescribed Zofran to take prior to each prep dose. New instructions sent to pt via MyChart.

## 2023-03-14 ENCOUNTER — Other Ambulatory Visit (INDEPENDENT_AMBULATORY_CARE_PROVIDER_SITE_OTHER): Payer: Self-pay | Admitting: Primary Care

## 2023-03-14 NOTE — Telephone Encounter (Signed)
Medication Refill -  Most Recent Primary Care Visit:  Provider: Grayce Sessions  Department: [facility]  Visit Type: OFFICE VISIT  Date: 12/14/2022  Medication: cetirizine (ZYRTEC ALLERGY) 10 MG tablet   Has the patient contacted their pharmacy? Yes (Agent: If no, request that the patient contact the pharmacy for the refill. If patient does not wish to contact the pharmacy document the reason why and proceed with request.) (Agent: If yes, when and what did the pharmacy advise?)  Is this the correct pharmacy for this prescription? Yes If no, delete pharmacy and type the correct one.  This is the patient's preferred pharmacy:  [facility] [address] [place] - [address] [place] [address] Phone: [phone] Fax: [phone]   Has the prescription been filled recently? No  Is the patient out of the medication? Yes  Has the patient been seen for an appointment in the last year OR does the patient have an upcoming appointment? Yes  Can we respond through MyChart? Yes  Agent: Please be advised that Rx refills may take up to 3 business days. We ask that you follow-up with your pharmacy.

## 2023-03-17 MED ORDER — CETIRIZINE HCL 10 MG PO TABS: 10.0000 mg | ORAL_TABLET | Freq: Every day | ORAL | 2 refills | Status: AC

## 2023-03-17 NOTE — Telephone Encounter (Signed)
Requested medications are due for refill today.  yes  Requested medications are on the active medications list.  yes  Last refill. 09/06/2021 #30 2 rf  Future visit scheduled.   yes  Notes to clinic.  Rx was written to expire 02/18/2023 - rx is expired.    Requested Prescriptions  Pending Prescriptions Disp Refills   cetirizine (ZYRTEC ALLERGY) 10 MG tablet 30 tablet 2    Sig: Take 1 tablet (10 mg total) by mouth at bedtime.     Ear, Nose, and Throat:  Antihistamines 2 Failed - 03/14/2023  2:40 PM      Failed - Cr in normal range and within 360 days    Creatinine, Ser  Date Value Ref Range Status  01/05/2021 0.74 0.57 - 1.00 mg/dL Final         Passed - Valid encounter within last 12 months    Recent Outpatient Visits           3 months ago Annual physical exam   [place] [facility] Grayce Sessions, NP   6 months ago Colon cancer screening   [place] [facility] Grayce Sessions, NP   1 year ago Hospital discharge follow-up   [place] [facility] Grayce Sessions, NP   2 years ago Need for Tdap vaccination   [place] [facility] Grayce Sessions, NP   2 years ago Hospital discharge follow-up   [place] [facility] Grayce Sessions, NP       Future Appointments             In 2 months Randa Evens, Kinnie Scales, NP [place] [facility]

## 2023-03-23 ENCOUNTER — Encounter: Payer: Medicaid Other | Admitting: Gastroenterology

## 2023-04-01 ENCOUNTER — Ambulatory Visit: Payer: Medicaid Other

## 2023-04-20 ENCOUNTER — Encounter (HOSPITAL_COMMUNITY): Payer: Self-pay

## 2023-04-20 ENCOUNTER — Telehealth (HOSPITAL_COMMUNITY): Payer: Medicaid Other | Admitting: Psychiatry

## 2023-04-20 ENCOUNTER — Encounter: Payer: Medicaid Other | Admitting: Gastroenterology

## 2023-04-27 ENCOUNTER — Ambulatory Visit
Admission: RE | Admit: 2023-04-27 | Discharge: 2023-04-27 | Disposition: A | Discharge status: Home / Self Care | Payer: Medicaid Other | Source: Ambulatory Visit | Attending: Primary Care | Admitting: Primary Care

## 2023-04-27 DIAGNOSIS — Z1231 Encounter for screening mammogram for malignant neoplasm of breast: Secondary | ICD-10-CM

## 2023-05-06 ENCOUNTER — Telehealth (INDEPENDENT_AMBULATORY_CARE_PROVIDER_SITE_OTHER): Payer: Medicaid Other | Admitting: Psychiatry

## 2023-05-06 ENCOUNTER — Encounter (HOSPITAL_COMMUNITY): Payer: Self-pay | Admitting: Psychiatry

## 2023-05-06 DIAGNOSIS — F411 Generalized anxiety disorder: Secondary | ICD-10-CM

## 2023-05-06 DIAGNOSIS — F32A Depression, unspecified: Secondary | ICD-10-CM

## 2023-05-06 MED ORDER — VENLAFAXINE HCL ER 37.5 MG PO CP24: 37.5000 mg | ORAL_CAPSULE | Freq: Every day | ORAL | 3 refills | Status: DC

## 2023-05-06 MED ORDER — HYDROXYZINE HCL 50 MG PO TABS: ORAL_TABLET | ORAL | 4 refills | Status: DC
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
## 2023-05-24 IMAGING — DX DG KNEE COMPLETE 4+V*L*
4 series · 4 of 4 positions shown · non-contrast
Comparison: None.

CLINICAL DATA: 50-year-old female with fall and trauma to the left
lower extremity.

EXAM:
LEFT ANKLE COMPLETE - 3+ VIEW; LEFT KNEE - COMPLETE 4+ VIEW

[knee ap]
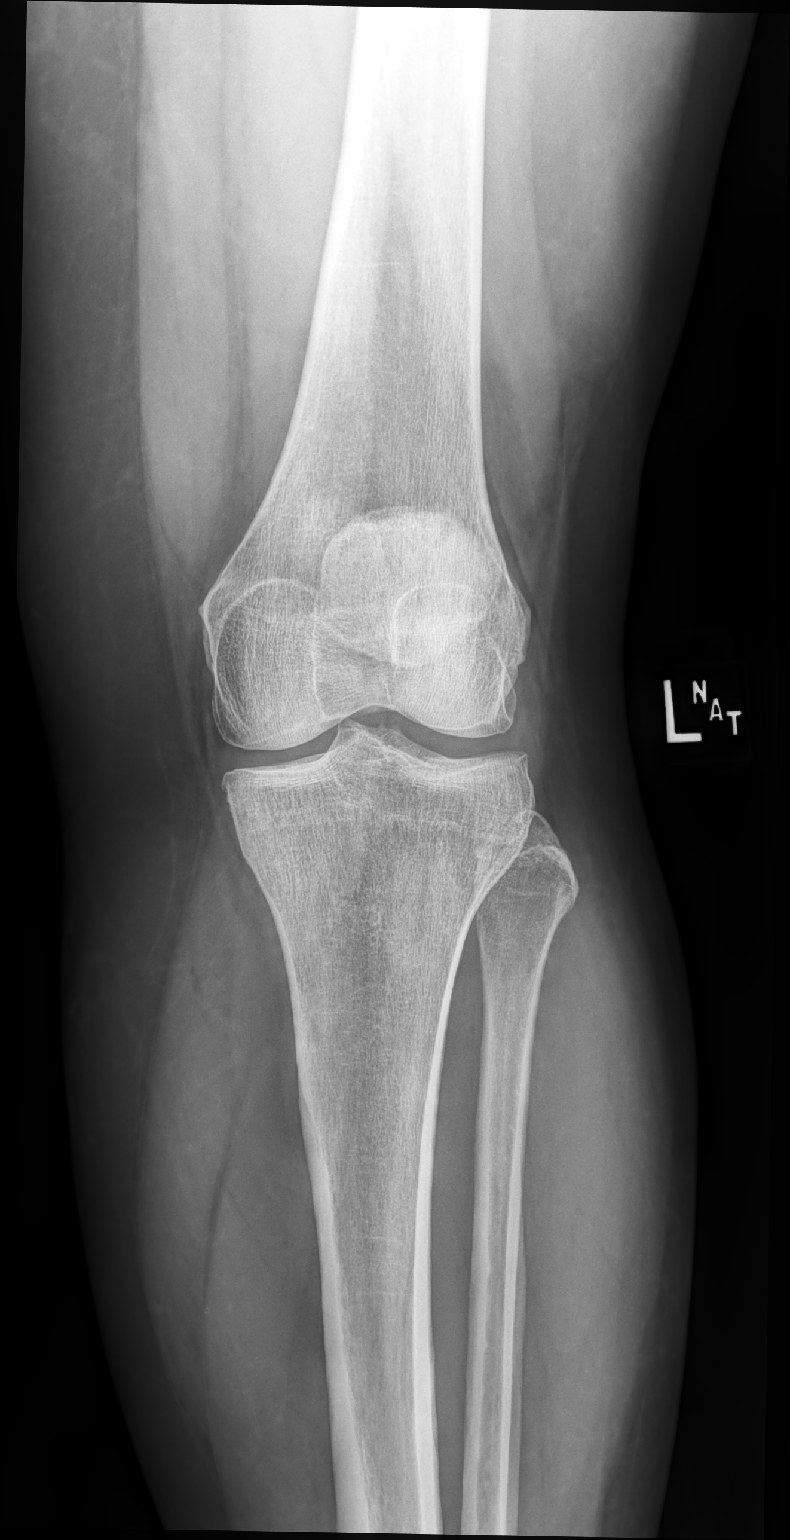

[knee lmo]
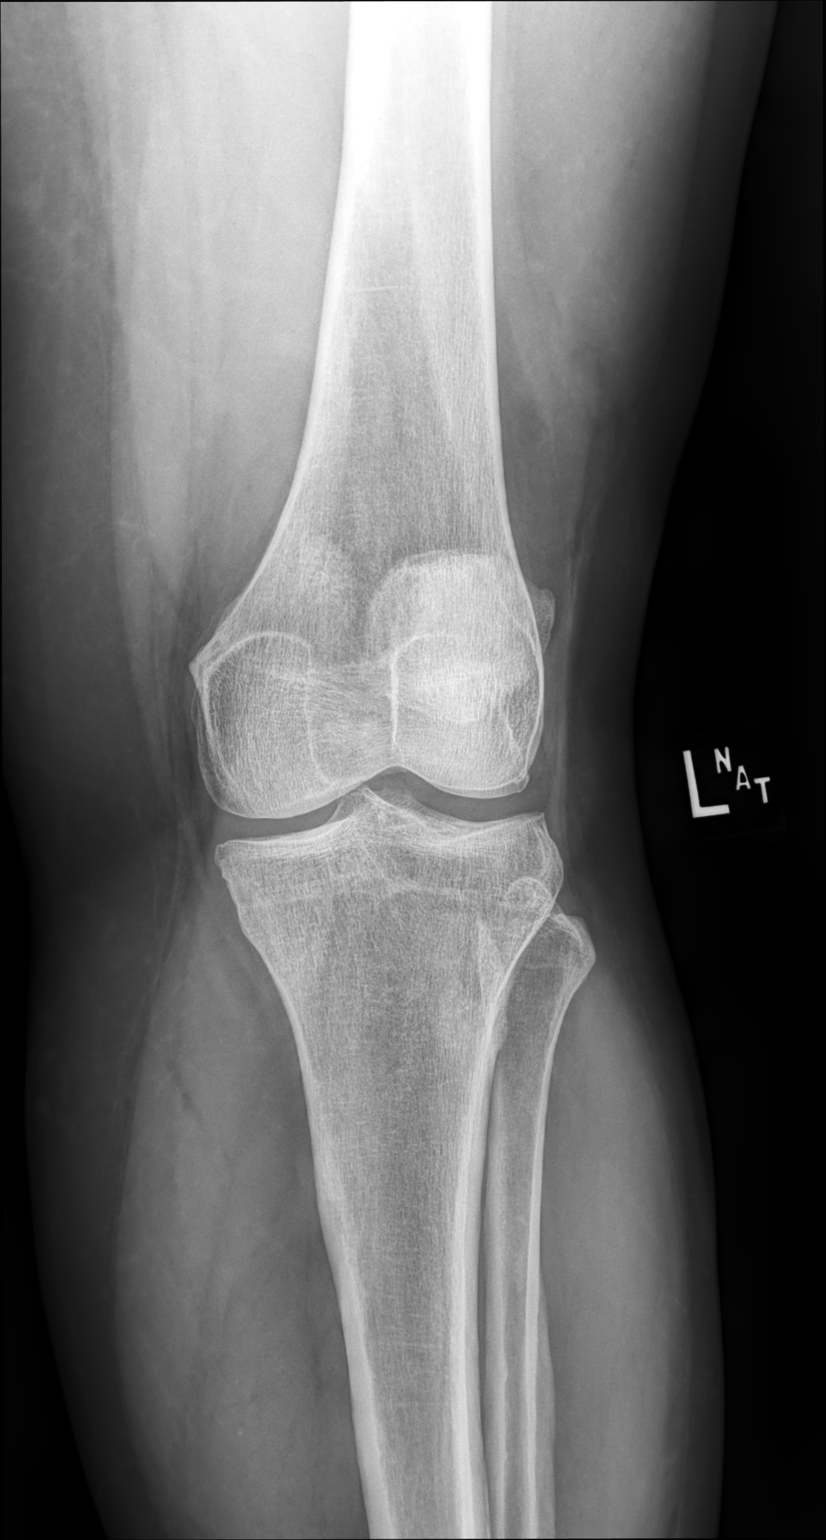

[knee mlo]
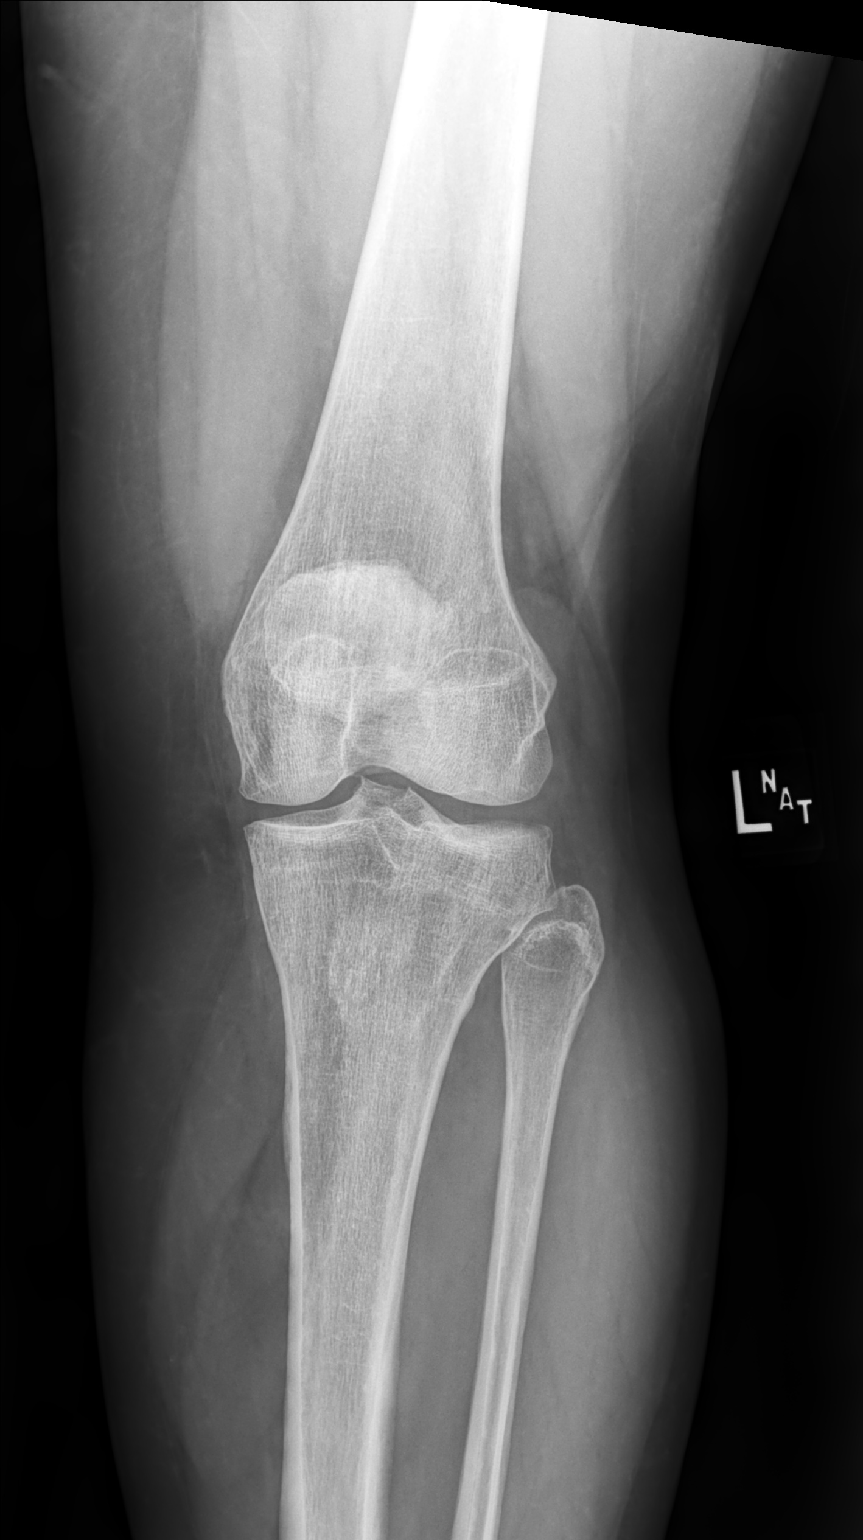

[knee lat]
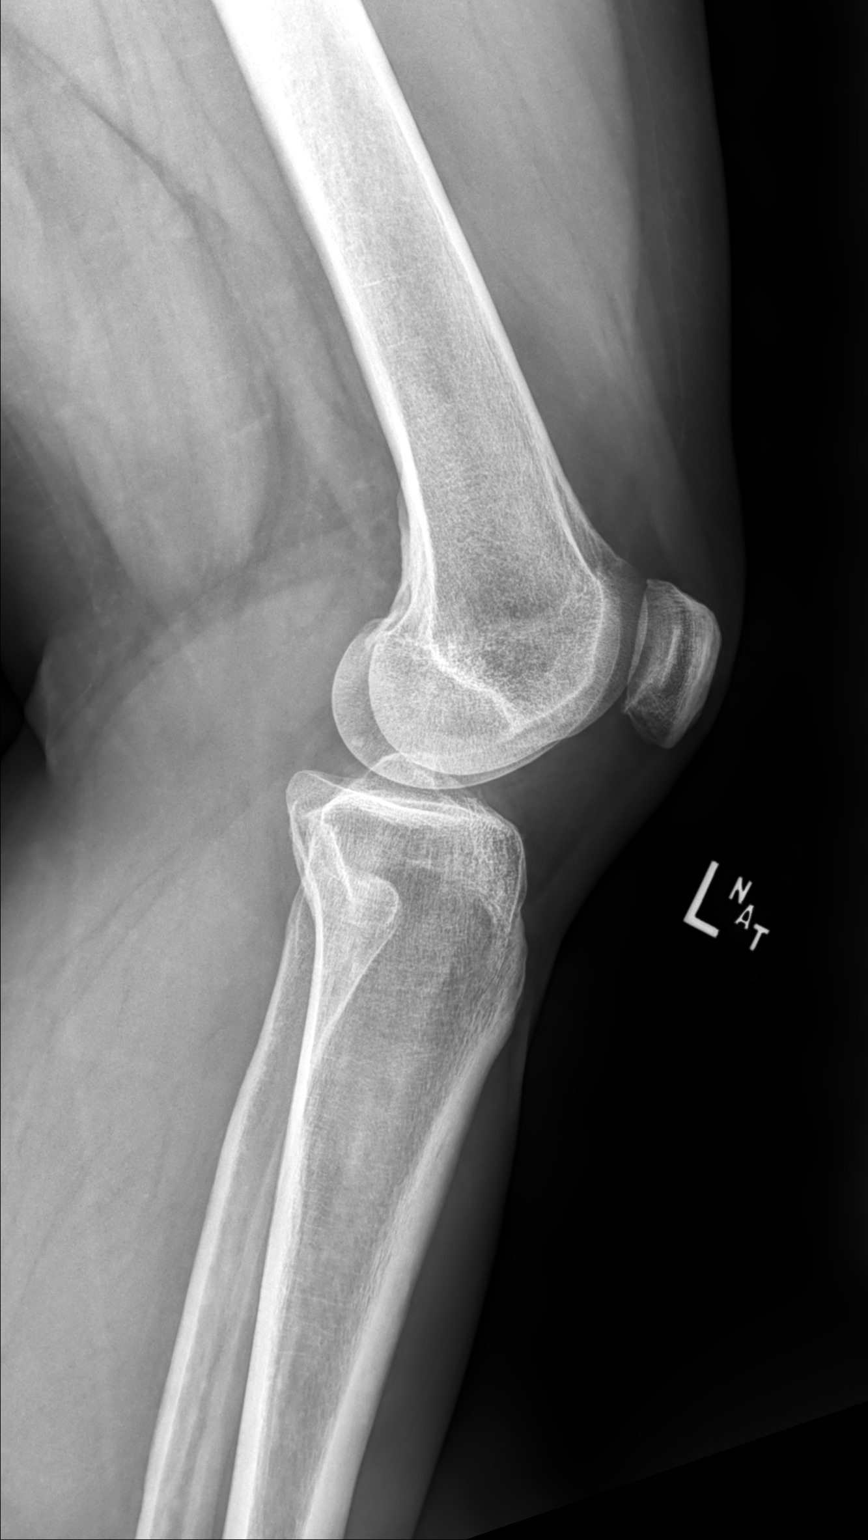

[4 of 4 positions shown; findings below may reference images not displayed]

FINDINGS: There is no acute fracture or dislocation. The bones are osteopenic.
There is mild arthritic changes of the left knee. No joint effusion.
The soft tissues are unremarkable.
IMPRESSION: No acute fracture or dislocation.

## 2023-05-30 ENCOUNTER — Ambulatory Visit
Admission: EM | Admit: 2023-05-30 | Discharge: 2023-05-30 | Disposition: A | Discharge status: Home / Self Care | Payer: Medicaid Other | Attending: Emergency Medicine | Admitting: Emergency Medicine

## 2023-05-30 ENCOUNTER — Encounter: Payer: Self-pay | Admitting: Emergency Medicine

## 2023-05-30 DIAGNOSIS — J069 Acute upper respiratory infection, unspecified: Secondary | ICD-10-CM | POA: Diagnosis not present

## 2023-05-30 MED ORDER — PROMETHAZINE-DM 6.25-15 MG/5ML PO SYRP: 5.0000 mL | ORAL_SOLUTION | Freq: Every evening | ORAL | 0 refills | Status: DC | PRN

## 2023-05-30 MED ORDER — AMOXICILLIN-POT CLAVULANATE 875-125 MG PO TABS: 1.0000 | ORAL_TABLET | Freq: Two times a day (BID) | ORAL | 0 refills | Status: DC

## 2023-05-30 NOTE — ED Provider Notes (Signed)
[facility]    CSN: [id number] Arrival date & time: 05/30/23  1017      History   Chief Complaint Chief Complaint  Patient presents with   Cough   Nasal Congestion   Abdominal Pain    HPI Sheila Zhang is a 53 y.o. female.   Patient presents for evaluation of nasal congestion, rhinorrhea, sore throat, nonproductive cough, generalized abdominal pain and nausea without vomiting present for 4 days. associated Itchy ears. known sick contacts.  Has attempted use of Tylenol, DayQuil and Pepto-Bismol which has been somewhat helpful.  Denies shortness of breath or wheezing.    Past Medical History:  Diagnosis Date   Anxiety    Dyslipidemia     Patient Active Problem List   Diagnosis Date Noted   Lateral epicondylitis, left elbow 05/12/2021   Generalized anxiety disorder 03/14/2021    Past Surgical History:  Procedure Laterality Date   tubaligation      OB History   No obstetric history on file.      Home Medications    Prior to Admission medications   Medication Sig Start Date End Date Taking? Authorizing Provider  venlafaxine XR (EFFEXOR-XR) 37.5 MG 24 hr capsule Take 1 capsule (37.5 mg total) by mouth daily. 05/06/23  Yes Toy Cookey E, NP  amoxicillin-clavulanate (AUGMENTIN) 875-125 MG tablet Take 1 tablet by mouth every 12 (twelve) hours. Patient not taking: Reported on 05/30/2023 02/18/23   Gustavus Bryant, FNP  atorvastatin (LIPITOR) 80 MG tablet TAKE 1 TABLET BY MOUTH EVERY DAY 12/31/21   Grayce Sessions, NP  benzonatate (TESSALON) 100 MG capsule Take 1 capsule (100 mg total) by mouth every 8 (eight) hours as needed for cough. Patient not taking: Reported on 05/30/2023 02/18/23   Gustavus Bryant, FNP  cetirizine (ZYRTEC ALLERGY) 10 MG tablet Take 1 tablet (10 mg total) by mouth at bedtime. 03/17/23 06/15/23  Grayce Sessions, NP  hydrOXYzine (ATARAX) 50 MG tablet TAKE 1 TABLET BY MOUTH EVERYDAY AT BEDTIME. NEED PRIMARY INSURANCE 05/06/23    Toy Cookey E, NP  ondansetron (ZOFRAN) 4 MG tablet Take 1 tablet (4 mg total) by mouth every 8 (eight) hours as needed for nausea or vomiting. Take one tablet 30 minutes to 1 hour before each dose of bowel prep Patient not taking: Reported on 05/30/2023 03/04/23   Jenel Lucks, MD  polyethylene glycol-electrolytes (NULYTELY) 420 g solution Take 4,000 mLs by mouth once. Patient not taking: Reported on 05/30/2023 02/14/23   [provider]    Family History Family History  Problem Relation Age of Onset   Rectal cancer Mother 65       stage 4 on chemo   Cervical cancer Mother 36   Colon cancer Neg Hx    Colon polyps Neg Hx    Esophageal cancer Neg Hx    Stomach cancer Neg Hx     Social History Social History   Tobacco Use   Smoking status: Never   Smokeless tobacco: Never  Vaping Use   Vaping status: Never Used  Substance Use Topics   Alcohol use: Never   Drug use: Never     Allergies   Escitalopram   Review of Systems Review of Systems  Constitutional: Negative.   HENT:  Positive for congestion, rhinorrhea and sore throat. Negative for dental problem, drooling, ear discharge, ear pain, facial swelling, hearing loss, mouth sores, nosebleeds, postnasal drip, sinus pressure, sinus pain, sneezing, tinnitus, trouble swallowing and voice change.  Respiratory:  Positive for cough. Negative for apnea, choking, chest tightness, shortness of breath, wheezing and stridor.   Gastrointestinal:  Positive for abdominal pain and nausea. Negative for abdominal distention, anal bleeding, blood in stool, constipation, diarrhea, rectal pain and vomiting.     Physical Exam Triage Vital Signs ED Triage Vitals  Encounter Vitals Group     BP 05/30/23 1158 116/78     Systolic BP Percentile --      Diastolic BP Percentile --      Pulse Rate 05/30/23 1158 84     Resp 05/30/23 1158 18     Temp 05/30/23 1158 98.3 F (36.8 C)     Temp Source 05/30/23 1158 Oral     SpO2  05/30/23 1158 96 %     Weight --      Height --      Head Circumference --      Peak Flow --      Pain Score 05/30/23 1159 2     Pain Loc --      Pain Education --      Exclude from Growth Chart --    No data found.  Updated Vital Signs BP 116/78 (BP Location: Left Arm)   Pulse 84   Temp 98.3 F (36.8 C) (Oral)   Resp 18   LMP  (LMP Unknown)   SpO2 96%   Visual Acuity Right Eye Distance:   Left Eye Distance:   Bilateral Distance:    Right Eye Near:   Left Eye Near:    Bilateral Near:     Physical Exam Constitutional:      Appearance: Normal appearance.  HENT:     Head: Normocephalic.     Right Ear: Tympanic membrane, ear canal and external ear normal.     Left Ear: Tympanic membrane, ear canal and external ear normal.     Nose: Congestion and rhinorrhea present.     Mouth/Throat:     Pharynx: No oropharyngeal exudate or posterior oropharyngeal erythema.  Cardiovascular:     Rate and Rhythm: Normal rate and regular rhythm.     Pulses: Normal pulses.     Heart sounds: Normal heart sounds.  Pulmonary:     Effort: Pulmonary effort is normal.     Breath sounds: Normal breath sounds.  Musculoskeletal:     Cervical back: Normal range of motion and neck supple.  Neurological:     Mental Status: She is alert and oriented to person, place, and time. Mental status is at baseline.      UC Treatments / Results  Labs (all labs ordered are listed, but only abnormal results are displayed) Labs Reviewed - No data to display  EKG   Radiology No results found.  Procedures Procedures (including critical care time)  Medications Ordered in UC Medications - No data to display  Initial Impression / Assessment and Plan / UC Course  I have reviewed the triage vital signs and the nursing notes.  Pertinent labs & imaging results that were available during my care of the patient were reviewed by me and considered in my medical decision making (see chart for  details).  Viral URI with cough  Patient is in no signs of distress nor toxic appearing.  Vital signs are stable.  Low suspicion for pneumonia, pneumothorax or bronchitis and therefore will defer imaging.  Promethazine DM, declined prednisone and Tessalon prescription as she deems ineffective, watchful wait antibiotic placed at pharmacy for day 10 if no improvement  seen.May use additional over-the-counter medications as needed for supportive care.  May follow-up with urgent care as needed if symptoms persist or worsen.  Note given.   Final Clinical Impressions(s) / UC Diagnoses   Final diagnoses:  None   Discharge Instructions   None    ED Prescriptions   None    PDMP not reviewed this encounter.   Valinda Hoar, NP 05/30/23 1248

## 2023-05-30 NOTE — Discharge Instructions (Signed)
Your symptoms today are most likely being caused by a virus and should steadily improve in time it can take up to 7 to 10 days before you truly start to see a turnaround however things will get better if no improvement seen by Saturday begin Augmentin which will be available at the pharmacy  You may use cough syrup at bedtime to allow you to rest    You can take Tylenol and/or Ibuprofen as needed for fever reduction and pain relief.   For cough: honey 1/2 to 1 teaspoon (you can dilute the honey in water or another fluid).  You can also use guaifenesin and dextromethorphan for cough. You can use a humidifier for chest congestion and cough.  If you don't have a humidifier, you can sit in the bathroom with the hot shower running.      For sore throat: try warm salt water gargles, cepacol lozenges, throat spray, warm tea or water with lemon/honey, popsicles or ice, or OTC cold relief medicine for throat discomfort.   For congestion: take a daily anti-histamine like Zyrtec, Claritin, and a oral decongestant, such as pseudoephedrine.  You can also use Flonase 1-2 sprays in each nostril daily.   It is important to stay hydrated: drink plenty of fluids (water, gatorade/powerade/pedialyte, juices, or teas) to keep your throat moisturized and help further relieve irritation/discomfort.

## 2023-05-30 NOTE — ED Triage Notes (Addendum)
Pt reports nasal congestion, runny nose, and dry cough x4 days. Denies fevers or chills. Commitant upper achy abdominal pain (RUQ/LUQ) x4 days. Denies vomiting and diarrhea. 1 period of nausea x3 days ago. Notes excessive gas with pain. Some relief with dayquil/nyquil at home. Temporary relief with pepto.

## 2023-06-13 ENCOUNTER — Ambulatory Visit (INDEPENDENT_AMBULATORY_CARE_PROVIDER_SITE_OTHER): Payer: Medicaid Other | Admitting: Primary Care

## 2023-06-30 ENCOUNTER — Telehealth (INDEPENDENT_AMBULATORY_CARE_PROVIDER_SITE_OTHER): Payer: Self-pay | Admitting: Primary Care

## 2023-06-30 NOTE — Telephone Encounter (Signed)
 Called pt to inform them about upcoming appt. Pt did not answer and could not leave VM.

## 2023-07-06 ENCOUNTER — Ambulatory Visit
Admission: EM | Admit: 2023-07-06 | Discharge: 2023-07-06 | Disposition: A | Discharge status: Home / Self Care | Attending: Internal Medicine | Admitting: Internal Medicine

## 2023-07-06 DIAGNOSIS — J069 Acute upper respiratory infection, unspecified: Secondary | ICD-10-CM

## 2023-07-06 LAB — POC COVID19/FLU A&B COMBO
Covid Antigen, POC: NEGATIVE
Influenza A Antigen, POC: NEGATIVE
Influenza B Antigen, POC: NEGATIVE

## 2023-07-06 NOTE — ED Provider Notes (Signed)
 [facility]    CSN: [id number] Arrival date & time: 07/06/23  1358      History   Chief Complaint Chief Complaint  Patient presents with   loss of taste   Cough   Sore Throat   Generalized Body Aches    HPI Sheila Zhang is a 53 y.o. female.   Patient presents with cough, nasal congestion, sore throat, body aches that started yesterday.  Reports flu exposure at work.  Denies any fever at home.  Has taken Tylenol for symptoms.  Denies history of asthma or COPD.   Cough Sore Throat    Past Medical History:  Diagnosis Date   Anxiety    Dyslipidemia     Patient Active Problem List   Diagnosis Date Noted   Lateral epicondylitis, left elbow 05/12/2021   Generalized anxiety disorder 03/14/2021    Past Surgical History:  Procedure Laterality Date   tubaligation      OB History   No obstetric history on file.      Home Medications    Prior to Admission medications   Medication Sig Start Date End Date Taking? Authorizing Provider  amoxicillin-clavulanate (AUGMENTIN) 875-125 MG tablet Take 1 tablet by mouth every 12 (twelve) hours. 06/04/23   Valinda Hoar, NP  atorvastatin (LIPITOR) 80 MG tablet TAKE 1 TABLET BY MOUTH EVERY DAY 12/31/21   Grayce Sessions, NP  benzonatate (TESSALON) 100 MG capsule Take 1 capsule (100 mg total) by mouth every 8 (eight) hours as needed for cough. Patient not taking: Reported on 05/30/2023 02/18/23   Gustavus Bryant, FNP  cetirizine (ZYRTEC ALLERGY) 10 MG tablet Take 1 tablet (10 mg total) by mouth at bedtime. 03/17/23 06/15/23  Grayce Sessions, NP  hydrOXYzine (ATARAX) 50 MG tablet TAKE 1 TABLET BY MOUTH EVERYDAY AT BEDTIME. NEED PRIMARY INSURANCE 05/06/23   Toy Cookey E, NP  ondansetron (ZOFRAN) 4 MG tablet Take 1 tablet (4 mg total) by mouth every 8 (eight) hours as needed for nausea or vomiting. Take one tablet 30 minutes to 1 hour before each dose of bowel prep Patient not taking: Reported on  05/30/2023 03/04/23   Jenel Lucks, MD  polyethylene glycol-electrolytes (NULYTELY) 420 g solution Take 4,000 mLs by mouth once. Patient not taking: Reported on 05/30/2023 02/14/23   [provider]  promethazine-dextromethorphan (PROMETHAZINE-DM) 6.25-15 MG/5ML syrup Take 5 mLs by mouth at bedtime as needed for cough. 05/30/23   Valinda Hoar, NP  venlafaxine XR (EFFEXOR-XR) 37.5 MG 24 hr capsule Take 1 capsule (37.5 mg total) by mouth daily. 05/06/23   Shanna Cisco, NP    Family History Family History  Problem Relation Age of Onset   Rectal cancer Mother 33       stage 4 on chemo   Cervical cancer Mother 85   Colon cancer Neg Hx    Colon polyps Neg Hx    Esophageal cancer Neg Hx    Stomach cancer Neg Hx     Social History Social History   Tobacco Use   Smoking status: Never   Smokeless tobacco: Never  Vaping Use   Vaping status: Never Used  Substance Use Topics   Alcohol use: Never   Drug use: Never     Allergies   Escitalopram   Review of Systems Review of Systems Per HPI  Physical Exam Triage Vital Signs ED Triage Vitals  Encounter Vitals Group     BP 07/06/23 1611 131/84     Systolic  BP Percentile --      Diastolic BP Percentile --      Pulse Rate 07/06/23 1611 81     Resp 07/06/23 1611 18     Temp 07/06/23 1611 98.1 F (36.7 C)     Temp Source 07/06/23 1611 Oral     SpO2 07/06/23 1611 97 %     Weight --      Height --      Head Circumference --      Peak Flow --      Pain Score 07/06/23 1609 3     Pain Loc --      Pain Education --      Exclude from Growth Chart --    No data found.  Updated Vital Signs BP 131/84 (BP Location: Right Arm)   Pulse 81   Temp 98.1 F (36.7 C) (Oral)   Resp 18   LMP  (LMP Unknown)   SpO2 97%   Visual Acuity Right Eye Distance:   Left Eye Distance:   Bilateral Distance:    Right Eye Near:   Left Eye Near:    Bilateral Near:     Physical Exam Constitutional:      General: She is  not in acute distress.    Appearance: Normal appearance. She is not toxic-appearing or diaphoretic.  HENT:     Head: Normocephalic and atraumatic.     Right Ear: Tympanic membrane and ear canal normal.     Left Ear: Tympanic membrane and ear canal normal.     Nose: Congestion present.     Mouth/Throat:     Mouth: Mucous membranes are moist.     Pharynx: No posterior oropharyngeal erythema.  Eyes:     Extraocular Movements: Extraocular movements intact.     Conjunctiva/sclera: Conjunctivae normal.     Pupils: Pupils are equal, round, and reactive to light.  Cardiovascular:     Rate and Rhythm: Normal rate and regular rhythm.     Pulses: Normal pulses.     Heart sounds: Normal heart sounds.  Pulmonary:     Effort: Pulmonary effort is normal. No respiratory distress.     Breath sounds: Normal breath sounds. No stridor. No wheezing, rhonchi or rales.  Musculoskeletal:        General: Normal range of motion.     Cervical back: Normal range of motion.  Skin:    General: Skin is warm and dry.  Neurological:     General: No focal deficit present.     Mental Status: She is alert and oriented to person, place, and time. Mental status is at baseline.  Psychiatric:        Mood and Affect: Mood normal.        Behavior: Behavior normal.      UC Treatments / Results  Labs (all labs ordered are listed, but only abnormal results are displayed) Labs Reviewed  POC COVID19/FLU A&B COMBO - Normal    EKG   Radiology No results found.  Procedures Procedures (including critical care time)  Medications Ordered in UC Medications - No data to display  Initial Impression / Assessment and Plan / UC Course  I have reviewed the triage vital signs and the nursing notes.  Pertinent labs & imaging results that were available during my care of the patient were reviewed by me and considered in my medical decision making (see chart for details).     Patient presents with symptoms likely from  a viral upper  respiratory infection. Do not suspect underlying cardiopulmonary process. Symptoms seem unlikely related to ACS, CHF or COPD exacerbations, pneumonia, pneumothorax. Patient is nontoxic appearing and not in need of emergent medical intervention. Covid and flu test negative.   Recommended symptom control with over the counter medications, fluids, rest.  Return if symptoms fail to improve in 1-2 weeks or you develop shortness of breath, chest pain, severe headache. Patient states understanding and is agreeable.  Discharged with PCP followup.  Final Clinical Impressions(s) / UC Diagnoses   Final diagnoses:  Viral upper respiratory tract infection with cough     Discharge Instructions      Suspect that you have a viral illness as we discussed that should run its course.  Follow-up if any symptoms persist or worsen.    ED Prescriptions   None    PDMP not reviewed this encounter.   Gustavus Bryant, [place] 07/06/23 [phone]

## 2023-07-06 NOTE — ED Triage Notes (Signed)
 Pt presents with URI symptoms, loss of taste, and sore throat symptoms that onset last night. Pt denies emesis and diarrhea.

## 2023-07-06 NOTE — Discharge Instructions (Signed)
 Suspect that you have a viral illness as we discussed that should run its course.  Follow-up if any symptoms persist or worsen.

## 2023-07-11 ENCOUNTER — Ambulatory Visit (INDEPENDENT_AMBULATORY_CARE_PROVIDER_SITE_OTHER): Admitting: Primary Care

## 2023-07-11 ENCOUNTER — Telehealth (INDEPENDENT_AMBULATORY_CARE_PROVIDER_SITE_OTHER): Payer: Self-pay | Admitting: Primary Care

## 2023-07-11 NOTE — Telephone Encounter (Signed)
 Called and was able to reschedule an appt for pt.

## 2023-07-13 ENCOUNTER — Telehealth (INDEPENDENT_AMBULATORY_CARE_PROVIDER_SITE_OTHER): Payer: Self-pay | Admitting: Primary Care

## 2023-07-13 NOTE — Telephone Encounter (Signed)
 Spoke to pt about appt.. Will be present

## 2023-07-20 ENCOUNTER — Ambulatory Visit (INDEPENDENT_AMBULATORY_CARE_PROVIDER_SITE_OTHER): Admitting: Primary Care

## 2023-07-20 ENCOUNTER — Encounter (INDEPENDENT_AMBULATORY_CARE_PROVIDER_SITE_OTHER): Payer: Self-pay | Admitting: Primary Care

## 2023-07-20 VITALS — BP 122/71 | HR 77 | Resp 16 | Ht 63.0 in | Wt 215.4 lb

## 2023-07-20 DIAGNOSIS — Z2821 Immunization not carried out because of patient refusal: Secondary | ICD-10-CM

## 2023-07-20 DIAGNOSIS — E782 Mixed hyperlipidemia: Secondary | ICD-10-CM

## 2023-07-20 DIAGNOSIS — Z1159 Encounter for screening for other viral diseases: Secondary | ICD-10-CM

## 2023-07-20 DIAGNOSIS — Z6836 Body mass index (BMI) 36.0-36.9, adult: Secondary | ICD-10-CM

## 2023-07-20 DIAGNOSIS — Z823 Family history of stroke: Secondary | ICD-10-CM | POA: Diagnosis not present

## 2023-07-20 DIAGNOSIS — Z1211 Encounter for screening for malignant neoplasm of colon: Secondary | ICD-10-CM

## 2023-07-20 DIAGNOSIS — R7303 Prediabetes: Secondary | ICD-10-CM

## 2023-07-20 DIAGNOSIS — E66812 Obesity, class 2: Secondary | ICD-10-CM

## 2023-07-20 DIAGNOSIS — Z114 Encounter for screening for human immunodeficiency virus [HIV]: Secondary | ICD-10-CM

## 2023-07-20 DIAGNOSIS — L299 Pruritus, unspecified: Secondary | ICD-10-CM

## 2023-07-20 DIAGNOSIS — E6609 Other obesity due to excess calories: Secondary | ICD-10-CM

## 2023-07-20 MED ORDER — HYDROCORTISONE 1 % EX OINT: 1.0000 | TOPICAL_OINTMENT | Freq: Two times a day (BID) | CUTANEOUS | 0 refills | Status: AC

## 2023-07-20 NOTE — Progress Notes (Signed)
 [facility]  Sheila Zhang, is a 53 y.o. female  VHQ:[id number]  UXL:[id number]  DOB - 24-Dec-1970  Chief Complaint  Patient presents with   Hyperlipidemia       Subjective:   Ms. Sheila Zhang is a 53 y.o. female here today for a follow up visit. Chronic conditions and labs. She c/o itching all the time . She takes benadryl at bedtime that helps her from scratching and to sleep.  No problems updated.  Comprehensive ROS Pertinent positive and negative noted in HPI   Allergies  Allergen Reactions   Escitalopram     Burning sensation     Past Medical History:  Diagnosis Date   Anxiety    Dyslipidemia     Current Outpatient Medications on File Prior to Visit  Medication Sig Dispense Refill   atorvastatin (LIPITOR) 80 MG tablet TAKE 1 TABLET BY MOUTH EVERY DAY 90 tablet 0   cetirizine (ZYRTEC ALLERGY) 10 MG tablet Take 1 tablet (10 mg total) by mouth at bedtime. 30 tablet 2   hydrOXYzine (ATARAX) 50 MG tablet TAKE 1 TABLET BY MOUTH EVERYDAY AT BEDTIME. NEED PRIMARY INSURANCE 90 tablet 4   promethazine-dextromethorphan (PROMETHAZINE-DM) 6.25-15 MG/5ML syrup Take 5 mLs by mouth at bedtime as needed for cough. (Patient not taking: Reported on 07/20/2023) 118 mL 0   venlafaxine XR (EFFEXOR-XR) 37.5 MG 24 hr capsule Take 1 capsule (37.5 mg total) by mouth daily. 90 capsule 3   No current facility-administered medications on file prior to visit.   Health Maintenance  Topic Date Due   COVID-19 Vaccine (1) Never done   HIV Screening  Never done   Hepatitis C Screening  Never done   Colon Cancer Screening  Never done   Zoster (Shingles) Vaccine (1 of 2) 10/19/2023*   Flu Shot  11/11/2023   Mammogram  04/26/2025   Pap with HPV screening  02/10/2026   DTaP/Tdap/Td vaccine (2 - Td or Tdap) 02/11/2031   HPV Vaccine  Aged Out  *Topic was postponed. The date shown is not the original due date.    Objective:   Vitals:   07/20/23 1407  BP: 122/71   Pulse: 77  Resp: 16  SpO2: 96%  Weight: 215 lb 6.4 oz (97.7 kg)  Height: 5\' 3"  (1.6 m)   BP Readings from Last 3 Encounters:  07/20/23 122/71  07/06/23 131/84  05/30/23 116/78      Physical Exam Vitals reviewed.  Constitutional:      Appearance: Normal appearance. She is obese.  HENT:     Head: Normocephalic.     Right Ear: Tympanic membrane, ear canal and external ear normal.     Left Ear: Tympanic membrane, ear canal and external ear normal.     Nose: Nose normal.     Mouth/Throat:     Mouth: Mucous membranes are moist.  Eyes:     Extraocular Movements: Extraocular movements intact.     Pupils: Pupils are equal, round, and reactive to light.  Cardiovascular:     Rate and Rhythm: Normal rate.  Pulmonary:     Effort: Pulmonary effort is normal.     Breath sounds: Normal breath sounds.  Abdominal:     General: Bowel sounds are normal.     Palpations: Abdomen is soft.  Musculoskeletal:        General: Normal range of motion.     Cervical back: Normal range of motion.  Skin:    General: Skin is warm and dry.  Findings: Rash present.  Neurological:     Mental Status: She is alert and oriented to person, place, and time.  Psychiatric:        Mood and Affect: Mood normal.        Behavior: Behavior normal.        Thought Content: Thought content normal.       Assessment & Plan  Deziyah was seen today for hyperlipidemia.  Diagnoses and all orders for this visit:  Need for hepatitis C screening test -     HCV Ab w Reflex to Quant PCR  Encounter for screening for HIV -     HIV Antibody (routine testing w rflx)  Class 2 obesity due to excess calories without serious comorbidity with body mass index (BMI) of 36.0 to 36.9 in adult -     CMP14+EGFR  Family history of CVA -     Lipid panel -     CBC with Differential/Platelet  Screening for colon cancer -     Fecal occult blood, imunochemical; Future  Herpes zoster vaccination  declined  Prediabetes - educated on lifestyle modifications, including but not limited to diet choices and adding exercise to daily routine.   -     Hemoglobin A1c -     CBC with Differential/Platelet  Mixed hyperlipidemia On statin 80mg  -     Lipid panel  Pruritus Also has hydroxyzine prescribe and not taking -     hydrocortisone 1 % ointment; Apply 1 Application topically 2 (two) times daily.  Patient have been counseled extensively about nutrition and exercise. Other issues discussed during this visit include: low cholesterol diet, weight control and daily exercise, foot care, annual eye examinations at Ophthalmology, importance of adherence with medications and regular follow-up. We also discussed long term complications of uncontrolled diabetes and hypertension.   Return in about 3 months (around 10/19/2023) for fasting labs.  The patient was given clear instructions to go to ER or return to medical center if symptoms don't improve, worsen or new problems develop. The patient verbalized understanding. The patient was told to call to get lab results if they haven't heard anything in the next week.   This note has been created with Education officer, environmental. Any transcriptional errors are unintentional.   Grayce Sessions, NP 07/20/2023, 2:30 PM

## 2023-07-20 NOTE — Patient Instructions (Signed)
 Hydroxyzine Capsules or Tablets What is this medication? HYDROXYZINE (hye DROX i zeen) treats the symptoms of allergies and allergic reactions. It may also be used to treat anxiety or cause drowsiness before a procedure. It works by blocking histamine, a substance released by the body during an allergic reaction. It belongs to a group of medications called antihistamines. This medicine may be used for other purposes; ask your health care provider or pharmacist if you have questions. COMMON BRAND NAME(S): ANX, Atarax, Rezine, Vistaril What should I tell my care team before I take this medication? They need to know if you have any of these conditions: Glaucoma Heart disease Irregular heartbeat or rhythm Kidney disease Liver disease Lung or breathing disease, such as asthma Stomach or intestine problems Thyroid disease Trouble passing urine An unusual or allergic reaction to hydroxyzine, other medications, foods, dyes or preservatives Pregnant or trying to get pregnant Breastfeeding How should I use this medication? Take this medication by mouth with a full glass of water. Take it as directed on the prescription label at the same time every day. You can take it with or without food. If it upsets your stomach, take it with food. Talk to your care team about the use of this medication in children. While it may be prescribed for children as young as 6 years for selected conditions, precautions do apply. People 65 years and older may have a stronger reaction and need a smaller dose. Overdosage: If you think you have taken too much of this medicine contact a poison control center or emergency room at once. NOTE: This medicine is only for you. Do not share this medicine with others. What if I miss a dose? If you miss a dose, take it as soon as you can. If it is almost time for your next dose, take only that dose. Do not take double or extra doses. What may interact with this medication? Do not  take this medication with any of the following: Cisapride Dronedarone Pimozide Thioridazine This medication may also interact with the following: Alcohol Antihistamines for allergy, cough, and cold Atropine Barbiturate medications for sleep or seizures, such as phenobarbital Certain antibiotics, such as erythromycin or clarithromycin Certain medications for anxiety or sleep Certain medications for bladder problems, such as oxybutynin or tolterodine Certain medications for irregular heartbeat Certain medications for mental health conditions Certain medications for Parkinson disease, such as benztropine, trihexyphenidyl Certain medications for seizures, such as phenobarbital or primidone Certain medications for stomach problems, such as dicyclomine or hyoscyamine Certain medications for travel sickness, such as scopolamine Ipratropium Opioid medications for pain Other medications that cause heart rhythm changes, such as dofetilide This list may not describe all possible interactions. Give your health care provider a list of all the medicines, herbs, non-prescription drugs, or dietary supplements you use. Also tell them if you smoke, drink alcohol, or use illegal drugs. Some items may interact with your medicine. What should I watch for while using this medication? Visit your care team for regular checks on your progress. Tell your care team if your symptoms do not start to get better or if they get worse. This medication may affect your coordination, reaction time, or judgment. Do not drive or operate machinery until you know how this medication affects you. Sit up or stand slowly to reduce the risk of dizzy or fainting spells. Drinking alcohol with this medication can increase the risk of these side effects. Your mouth may get dry. Chewing sugarless gum or sucking hard candy  and drinking plenty of water may help. Contact your care team if the problem does not go away or is severe. This  medication may cause dry eyes and blurred vision. If you wear contact lenses, you may feel some discomfort. Lubricating eye drops may help. See your care team if the problem does not go away or is severe. If you are receiving skin tests for allergies, tell your care team you are taking this medication. What side effects may I notice from receiving this medication? Side effects that you should report to your care team as soon as possible: Allergic reactions--skin rash, itching, hives, swelling of the face, lips, tongue, or throat Heart rhythm changes--fast or irregular heartbeat, dizziness, feeling faint or lightheaded, chest pain, trouble breathing Side effects that usually do not require medical attention (report to your care team if they continue or are bothersome): Confusion Drowsiness Dry mouth Hallucinations Headache This list may not describe all possible side effects. Call your doctor for medical advice about side effects. You may report side effects to FDA at 1-800-FDA-1088. Where should I keep my medication? Keep out of the reach of children and pets. Store at room temperature between 15 and 30 degrees C (59 and 86 degrees F). Keep container tightly closed. Throw away any unused medication after the expiration date. NOTE: This sheet is a summary. It may not cover all possible information. If you have questions about this medicine, talk to your doctor, pharmacist, or health care provider.  2024 Elsevier/Gold Standard (2021-11-06 00:00:00)

## 2023-07-21 ENCOUNTER — Other Ambulatory Visit (INDEPENDENT_AMBULATORY_CARE_PROVIDER_SITE_OTHER): Payer: Self-pay | Admitting: Primary Care

## 2023-07-21 DIAGNOSIS — E782 Mixed hyperlipidemia: Secondary | ICD-10-CM

## 2023-07-21 LAB — LIPID PANEL
Chol/HDL Ratio: 5.2 ratio — ABNORMAL HIGH (ref 0.0–4.4)
Cholesterol, Total: 257 mg/dL — ABNORMAL HIGH (ref 100–199)
HDL: 49 mg/dL (ref >39)
LDL Chol Calc (NIH): 170 mg/dL — ABNORMAL HIGH (ref 0–99)
Triglycerides: 206 mg/dL — ABNORMAL HIGH (ref 0–149)
VLDL Cholesterol Cal: 38 mg/dL (ref 5–40)

## 2023-07-21 LAB — CMP14+EGFR
ALT: 27 IU/L (ref 0–32)
AST: 22 IU/L (ref 0–40)
Albumin: 4.3 g/dL (ref 3.9–4.9)
Alkaline Phosphatase: 120 IU/L (ref 44–121)
BUN/Creatinine Ratio: 13 (ref 9–23)
BUN: 10 mg/dL (ref 6–24)
Bilirubin Total: <0.2 mg/dL (ref 0.0–1.2)
CO2: 21 mmol/L (ref 20–29)
Calcium: 9.3 mg/dL (ref 8.7–10.2)
Chloride: 106 mmol/L (ref 96–106)
Creatinine, Ser: 0.79 mg/dL (ref 0.57–1.00)
Globulin, Total: 2.5 g/dL (ref 1.5–4.5)
Glucose: 87 mg/dL (ref 70–99)
Potassium: 4.3 mmol/L (ref 3.5–5.2)
Sodium: 142 mmol/L (ref 134–144)
Total Protein: 6.8 g/dL (ref 6.0–8.5)
eGFR: 91 mL/min/{1.73_m2} (ref >59)

## 2023-07-21 LAB — HEMOGLOBIN A1C
Est. average glucose Bld gHb Est-mCnc: 134 mg/dL
Hgb A1c MFr Bld: 6.3 % — ABNORMAL HIGH (ref 4.8–5.6)

## 2023-07-21 LAB — HCV AB W REFLEX TO QUANT PCR: HCV Ab: NONREACTIVE

## 2023-07-21 LAB — CBC WITH DIFFERENTIAL/PLATELET
Basophils Absolute: 0.1 10*3/uL (ref 0.0–0.2)
Basos: 1 %
EOS (ABSOLUTE): 0.2 10*3/uL (ref 0.0–0.4)
Eos: 3 %
Hematocrit: 40.7 % (ref 34.0–46.6)
Hemoglobin: 13 g/dL (ref 11.1–15.9)
Immature Grans (Abs): 0 10*3/uL (ref 0.0–0.1)
Immature Granulocytes: 0 %
Lymphocytes Absolute: 2.8 10*3/uL (ref 0.7–3.1)
Lymphs: 38 %
MCH: 28.1 pg (ref 26.6–33.0)
MCHC: 31.9 g/dL (ref 31.5–35.7)
MCV: 88 fL (ref 79–97)
Monocytes Absolute: 0.6 10*3/uL (ref 0.1–0.9)
Monocytes: 8 %
Neutrophils Absolute: 3.7 10*3/uL (ref 1.4–7.0)
Neutrophils: 50 %
Platelets: 387 10*3/uL (ref 150–450)
RBC: 4.62 x10E6/uL (ref 3.77–5.28)
RDW: 13.9 % (ref 11.7–15.4)
WBC: 7.4 10*3/uL (ref 3.4–10.8)

## 2023-07-21 LAB — HIV ANTIBODY (ROUTINE TESTING W REFLEX): HIV Screen 4th Generation wRfx: NONREACTIVE

## 2023-07-21 LAB — HCV INTERPRETATION

## 2023-07-21 NOTE — Telephone Encounter (Signed)
 Copied from CRM [id number]. Topic: Clinical - Medication Refill >> Jul 21, 2023 12:32 PM Fuller Mandril wrote: Most Recent Primary Care Visit:  Provider: Grayce Sessions  Department: [facility]  Visit Type: OFFICE VISIT  Date: 07/20/2023  Medication: atorvastatin (LIPITOR) 80 MG tablet - was supposed to send yesterday at visit did not get sent   Has the patient contacted their pharmacy? Yes (Agent: If no, request that the patient contact the pharmacy for the refill. If patient does not wish to contact the pharmacy document the reason why and proceed with request.) (Agent: If yes, when and what did the pharmacy advise?) not received   Is this the correct pharmacy for this prescription? Yes If no, delete pharmacy and type the correct one.  This is the patient's preferred pharmacy:  [facility] [address], [place] - [address] [place] [address] Phone: [phone] Fax: [phone]   Has the prescription been filled recently? No  Is the patient out of the medication? Yes  Has the patient been seen for an appointment in the last year OR does the patient have an upcoming appointment? Yes  Can we respond through MyChart? Yes  Agent: Please be advised that Rx refills may take up to 3 business days. We ask that you follow-up with your pharmacy.

## 2023-07-22 ENCOUNTER — Encounter (INDEPENDENT_AMBULATORY_CARE_PROVIDER_SITE_OTHER): Payer: Self-pay | Admitting: Primary Care

## 2023-07-22 ENCOUNTER — Other Ambulatory Visit (INDEPENDENT_AMBULATORY_CARE_PROVIDER_SITE_OTHER): Payer: Self-pay | Admitting: Primary Care

## 2023-07-22 DIAGNOSIS — E782 Mixed hyperlipidemia: Secondary | ICD-10-CM

## 2023-07-22 MED ORDER — ATORVASTATIN CALCIUM 80 MG PO TABS
80.0000 mg | ORAL_TABLET | Freq: Every day | ORAL | 1 refills | Status: AC
Start: 2023-07-22 — End: ?

## 2023-07-22 NOTE — Telephone Encounter (Signed)
 Rx 07/22/23 #90 1RF- duplicate request Requested Prescriptions  Pending Prescriptions Disp Refills   atorvastatin (LIPITOR) 80 MG tablet 90 tablet 0    Sig: Take 1 tablet (80 mg total) by mouth daily.     Cardiovascular:  Antilipid - Statins Failed - 07/22/2023 10:46 AM      Failed - Lipid Panel in normal range within the last 12 months    Cholesterol, Total  Date Value Ref Range Status  07/20/2023 257 (H) 100 - 199 mg/dL Final   LDL Chol Calc (NIH)  Date Value Ref Range Status  07/20/2023 170 (H) 0 - 99 mg/dL Final   HDL  Date Value Ref Range Status  07/20/2023 49 >39 mg/dL Final   Triglycerides  Date Value Ref Range Status  07/20/2023 206 (H) 0 - 149 mg/dL Final         Passed - Patient is not pregnant      Passed - Valid encounter within last 12 months    Recent Outpatient Visits           2 days ago Need for hepatitis C screening test   [place] [facility] Grayce Sessions, NP   7 months ago Annual physical exam   [place] [facility] Grayce Sessions, NP   10 months ago Colon cancer screening   [place] [facility] Grayce Sessions, NP   1 year ago Hospital discharge follow-up   [place] [facility] Grayce Sessions, NP   2 years ago Need for Tdap vaccination    [facility] Grayce Sessions, NP

## 2023-07-27 ENCOUNTER — Encounter (HOSPITAL_COMMUNITY): Payer: Self-pay | Admitting: Psychiatry

## 2023-07-27 ENCOUNTER — Telehealth (HOSPITAL_COMMUNITY): Payer: Medicaid Other | Admitting: Psychiatry

## 2023-07-27 DIAGNOSIS — F32A Depression, unspecified: Secondary | ICD-10-CM | POA: Diagnosis not present

## 2023-07-27 DIAGNOSIS — F411 Generalized anxiety disorder: Secondary | ICD-10-CM | POA: Diagnosis not present

## 2023-07-27 DIAGNOSIS — L299 Pruritus, unspecified: Secondary | ICD-10-CM | POA: Diagnosis not present

## 2023-07-27 MED ORDER — HYDROXYZINE HCL 10 MG PO TABS: 10.0000 mg | ORAL_TABLET | Freq: Three times a day (TID) | ORAL | 3 refills | Status: DC | PRN

## 2023-07-27 MED ORDER — VENLAFAXINE HCL ER 37.5 MG PO CP24: 37.5000 mg | ORAL_CAPSULE | Freq: Every day | ORAL | 3 refills | Status: DC

## 2023-07-27 MED ORDER — HYDROXYZINE HCL 50 MG PO TABS: ORAL_TABLET | ORAL | 4 refills | Status: DC

## 2023-07-27 NOTE — Progress Notes (Signed)
 BH MD/PA/NP OP Progress Note Virtual Visit via Video Note  I connected with Charleston Vierling on 07/27/23 at  8:30 AM EDT by a video enabled telemedicine application and verified that I am speaking with the correct person using two identifiers.  Location: Patient: Work Provider: Clinic   I discussed the limitations of evaluation and management by telemedicine and the availability of in person appointments. The patient expressed understanding and agreed to proceed.  I provided 30 minutes of non-face-to-face time during this encounter.    07/27/2023 8:52 AM Yetunde Leis  MRN:  [id number]  Chief Complaint: "Things are good"  HPI: 53 year old female seen today for follow-up psychiatric evaluation.  She has a psychiatric history of anxiety.  She is currently being managed on Effexor 37.5 mg and hydroxyzine 50 mg nightly.  She notes her medications are effective in managing her psychiatric condition.  Today patient was pleasant, cooperative, and engaged in conversation.  She informed Clinical research associate that things are good.  She informed Clinical research associate that she continues to work at [employer] and finds her job enjoyable.  At times it is stressful but she notes that she is able to cope.  Patient informed Clinical research associate that her mother's rectal cancer has gone into remission however notes that she still has liver cancer.  Patient reports that she worries about her children but notes that overall they are doing well.  Since her last visit she notes that her anxiety and depression has improved.  Today provider conducted a GAD-7 and patient scored 9, at last visit she scored a 13.  Provider also conducted PHQ-9 and a score of 2, at her last visit she scored a 7.  She endorsed adequate sleep.  Today she denies SI/HI/VAH, mania, paranoia.   Patient informed writer that she has been having back and neck pain intermittently.  Patient also notes that she has been having itching and under her arms and belly. She was prescribed  hydrocortisone by her PCP.  Provider asked patient if she had contact dermatitis, change in her soap or laundry detergent.  She notes that she has not.  She informed Clinical research associate that when she sweats makes it worse.  Provider recommended taking hydroxyzine at a lower dose to help manage potential allergen as patient notes that she works in the area that that is full of dust.    Today patient agreeable to starting hydroxyzine 10 mg 3 times a day as needed to help manage itching/allergen.  She will continue her other medications as prescribed.  No other concerns noted at this time.  Visit Diagnosis:    ICD-10-CM   1. Itching  L29.9 hydrOXYzine (ATARAX) 50 MG tablet    hydrOXYzine (ATARAX) 10 MG tablet    2. Generalized anxiety disorder  F41.1 hydrOXYzine (ATARAX) 50 MG tablet    venlafaxine XR (EFFEXOR-XR) 37.5 MG 24 hr capsule    hydrOXYzine (ATARAX) 10 MG tablet    3. Mild depression  F32.A venlafaxine XR (EFFEXOR-XR) 37.5 MG 24 hr capsule          Past Psychiatric History: Anxiety  Past Medical History:  Past Medical History:  Diagnosis Date   Anxiety    Dyslipidemia     Past Surgical History:  Procedure Laterality Date   tubaligation      Family Psychiatric History: Mother: anxiety, suicidal ideations, depression. Sister: anxiety.  Family History:  Family History  Problem Relation Age of Onset   Rectal cancer Mother 41       stage 4 on chemo  Cervical cancer Mother 17   Colon cancer Neg Hx    Colon polyps Neg Hx    Esophageal cancer Neg Hx    Stomach cancer Neg Hx     Social History:  Social History   Socioeconomic History   Marital status: Single    Spouse name: Not on file   Number of children: Not on file   Years of education: Not on file   Highest education level: Not on file  Occupational History   Not on file  Tobacco Use   Smoking status: Never   Smokeless tobacco: Never  Vaping Use   Vaping status: Never Used  Substance and Sexual Activity    Alcohol use: Never   Drug use: Never   Sexual activity: Not Currently  Other Topics Concern   Not on file  Social History Narrative   Not on file   Social Drivers of Health   Financial Resource Strain: Not on file  Food Insecurity: No Food Insecurity (07/20/2023)   Hunger Vital Sign    Worried About Running Out of Food in the Last Year: Never true    Ran Out of Food in the Last Year: Never true  Transportation Needs: No Transportation Needs (07/20/2023)   PRAPARE - Administrator, Civil Service (Medical): No    Lack of Transportation (Non-Medical): No  Physical Activity: Not on file  Stress: Not on file  Social Connections: Not on file    Allergies:  Allergies  Allergen Reactions   Escitalopram     Burning sensation     Metabolic Disorder Labs: Lab Results  Component Value Date   HGBA1C 6.3 (H) 07/20/2023   No results found for: "PROLACTIN" Lab Results  Component Value Date   CHOL 257 (H) 07/20/2023   TRIG 206 (H) 07/20/2023   HDL 49 07/20/2023   CHOLHDL 5.2 (H) 07/20/2023   LDLCALC 170 (H) 07/20/2023   LDLCALC 137 (H) 01/05/2021   Lab Results  Component Value Date   TSH 1.030 01/05/2021    Therapeutic Level Labs: No results found for: "LITHIUM" No results found for: "VALPROATE" No results found for: "CBMZ"  Current Medications: Current Outpatient Medications  Medication Sig Dispense Refill   hydrOXYzine (ATARAX) 10 MG tablet Take 1 tablet (10 mg total) by mouth 3 (three) times daily as needed. 90 tablet 3   atorvastatin (LIPITOR) 80 MG tablet Take 1 tablet (80 mg total) by mouth daily. 90 tablet 1   cetirizine (ZYRTEC ALLERGY) 10 MG tablet Take 1 tablet (10 mg total) by mouth at bedtime. 30 tablet 2   hydrocortisone 1 % ointment Apply 1 Application topically 2 (two) times daily. 453 g 0   hydrOXYzine (ATARAX) 50 MG tablet TAKE 1 TABLET BY MOUTH EVERYDAY AT BEDTIME. NEED PRIMARY INSURANCE 90 tablet 4   promethazine-dextromethorphan  (PROMETHAZINE-DM) 6.25-15 MG/5ML syrup Take 5 mLs by mouth at bedtime as needed for cough. (Patient not taking: Reported on 07/20/2023) 118 mL 0   venlafaxine XR (EFFEXOR-XR) 37.5 MG 24 hr capsule Take 1 capsule (37.5 mg total) by mouth daily. 90 capsule 3   No current facility-administered medications for this visit.     Musculoskeletal: Strength & Muscle Tone: within normal limits and Telehealth visit Gait & Station: normal, Telehealth Patient leans: N/A  Psychiatric Specialty Exam: Review of Systems  There were no vitals taken for this visit.There is no height or weight on file to calculate BMI.  General Appearance: Well Groomed  Eye Contact:  Good  Speech:  Clear and Coherent and Normal Rate  Volume:  Normal  Mood:  Euthymic  Affect:  Appropriate and Congruent  Thought Process:  Coherent, Goal Directed, and Linear  Orientation:  Full (Time, Place, and Person)  Thought Content: WDL and Logical   Suicidal Thoughts:  No  Homicidal Thoughts:  No  Memory:  Immediate;   Good Recent;   Good Remote;   Good  Judgement:  Good  Insight:  Good  Psychomotor Activity:  Normal  Concentration:  Concentration: Good and Attention Span: Good  Recall:  Good  Fund of Knowledge: Good  Language: Good  Akathisia:  No  Handed:  Right  AIMS (if indicated): not done  Assets:  Communication Skills Desire for Improvement Financial Resources/Insurance Housing Intimacy Physical Health Social Support Transportation Vocational/Educational  ADL's:  Intact  Cognition: WNL  Sleep:  Good   Screenings: GAD-7    Flowsheet Row Video Visit from 07/27/2023 in [facility] Video Visit from 05/06/2023 in [facility] Video Visit from 02/02/2023 in [facility] Office Visit from 12/14/2022 in [facility] Video Visit from 11/10/2022 in [facility]  Total GAD-7 Score 9 13  9 6 11       PHQ2-9    Flowsheet Row Video Visit from 07/27/2023 in [facility] Visit from 07/20/2023 in [facility] Video Visit from 05/06/2023 in [facility] Video Visit from 02/02/2023 in [facility] Office Visit from 12/14/2022 in [facility]  PHQ-2 Total Score 0 2 2 1 2   PHQ-9 Total Score 2 5 7 3 6       Flowsheet Row Video Visit from 07/27/2023 in [facility] ED from 07/06/2023 in [facility]) ED from 05/30/2023 in [facility])  C-SSRS RISK CATEGORY No Risk No Risk No Risk        Assessment and Plan: Patient notes that her anxiety and depression has improved since her last visit.  She does however report that recently she has been having itching on her belly, hand, and underarms.  She was given hydrocortisone to help and reports it was somewhat effective.Today patient agreeable to starting hydroxyzine 10 mg 3 times a day as needed to help manage itching/allergen.  She will continue her other medications as prescribed.  1. Generalized anxiety disorder  Continue- hydrOXYzine (ATARAX) 50 MG tablet; TAKE 1 TABLET BY MOUTH EVERYDAY AT BEDTIME. NEED PRIMARY INSURANCE  Dispense: 90 tablet; Refill: 4 Continue- venlafaxine XR (EFFEXOR-XR) 37.5 MG 24 hr capsule; Take 1 capsule (37.5 mg total) by mouth daily.  Dispense: 90 capsule; Refill: 3 Start- hydrOXYzine (ATARAX) 10 MG tablet; Take 1 tablet (10 mg total) by mouth 3 (three) times daily as needed.  Dispense: 90 tablet; Refill: 3  2. Mild depression  Continue- venlafaxine XR (EFFEXOR-XR) 37.5 MG 24 hr capsule; Take 1 capsule (37.5 mg total) by mouth daily.  Dispense: 90 capsule; Refill: 3  3. Itching (Primary)  Continue - hydrOXYzine (ATARAX) 50 MG tablet; TAKE 1 TABLET BY MOUTH EVERYDAY  AT BEDTIME. NEED PRIMARY INSURANCE  Dispense: 90 tablet; Refill: 4 Start- hydrOXYzine (ATARAX) 10 MG tablet; Take 1 tablet (10 mg total) by mouth 3 (three) times daily as needed.  Dispense: 90 tablet; Refill: 3   Follow-up in 2.5 months   Gloyd Happ  Lonn Roads, NP 07/27/2023, 8:52 AM

## 2023-08-20 ENCOUNTER — Other Ambulatory Visit (HOSPITAL_COMMUNITY): Payer: Self-pay | Admitting: Psychiatry

## 2023-08-20 DIAGNOSIS — F411 Generalized anxiety disorder: Secondary | ICD-10-CM

## 2023-08-20 DIAGNOSIS — L299 Pruritus, unspecified: Secondary | ICD-10-CM

## 2023-09-09 ENCOUNTER — Encounter (HOSPITAL_COMMUNITY): Payer: Self-pay | Admitting: Psychiatry

## 2023-09-09 ENCOUNTER — Telehealth (HOSPITAL_COMMUNITY): Admitting: Psychiatry

## 2023-09-09 DIAGNOSIS — L299 Pruritus, unspecified: Secondary | ICD-10-CM

## 2023-09-09 DIAGNOSIS — F411 Generalized anxiety disorder: Secondary | ICD-10-CM | POA: Diagnosis not present

## 2023-09-09 DIAGNOSIS — F32A Depression, unspecified: Secondary | ICD-10-CM

## 2023-09-09 MED ORDER — HYDROXYZINE HCL 10 MG PO TABS: 10.0000 mg | ORAL_TABLET | Freq: Three times a day (TID) | ORAL | 2 refills | Status: DC | PRN

## 2023-09-09 MED ORDER — HYDROXYZINE HCL 50 MG PO TABS: ORAL_TABLET | ORAL | 4 refills | Status: DC

## 2023-09-09 MED ORDER — VENLAFAXINE HCL ER 37.5 MG PO CP24: 37.5000 mg | ORAL_CAPSULE | Freq: Every day | ORAL | 3 refills | Status: DC

## 2023-09-09 NOTE — Progress Notes (Signed)
 BH MD/PA/NP OP Progress Note Virtual Visit via Video Note  I connected with Sheila Zhang on 09/09/23 at 11:00 AM EDT by a video enabled telemedicine application and verified that I am speaking with the correct person using two identifiers.  Location: Patient: Work Provider: Clinic   I discussed the limitations of evaluation and management by telemedicine and the availability of in person appointments. The patient expressed understanding and agreed to proceed.  I provided 30 minutes of non-face-to-face time during this encounter.    09/09/2023 9:26 AM Sheila Zhang  MRN:  [id number]  Chief Complaint: "Things are good"  HPI: 53 year old female seen today for follow-up psychiatric evaluation.  She has a psychiatric history of anxiety.  She is currently being managed on Effexor  37.5 mg, hydroxyzine  10 mg 3 times daily as needed, and hydroxyzine  50 mg nightly.  She notes her medications are effective in managing her psychiatric condition.  Today patient was pleasant, cooperative, and engaged in conversation.  She informed Clinical research associate that not much is changed since her last visit.  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  She reports that her mood continues to be stable and notes that she has minimal anxiety and depression.  Today provider conducted a GAD-7 and patient scored 9, at last visit she scored a 9.  Provider also conducted PHQ-9 and a score of 8, at her last visit she scored a 2.  She endorsed adequate sleep.  Today she denies SI/HI/VAH, mania, paranoia.   Patient informed writer that she continues to have back and neck pain intermittently.  She quantifies her pain today as 2 out of 10.  She notes that her PCP has plan to make referrals to address this pain.  Since starting hydroxyzine  nightly patient notes that her sleep is well-managed.  She also takes it in the daytime and notes that her itching under her arms and on her belly has  improved.  She is also prescribed hydrocortisone  by her PCP.    No medication changes made today.  Patient agreeable to continue medication as prescribed.  No other concerns noted at this time.  Visit Diagnosis:    ICD-10-CM   1. Generalized anxiety disorder  F41.1 hydrOXYzine  (ATARAX ) 50 MG tablet    hydrOXYzine  (ATARAX ) 10 MG tablet    venlafaxine  XR (EFFEXOR -XR) 37.5 MG 24 hr capsule    2. Itching  L29.9 hydrOXYzine  (ATARAX ) 50 MG tablet    hydrOXYzine  (ATARAX ) 10 MG tablet    3. Mild depression  F32.A venlafaxine  XR (EFFEXOR -XR) 37.5 MG 24 hr capsule           Past Psychiatric History: Anxiety  Past Medical History:  Past Medical History:  Diagnosis Date   Anxiety    Dyslipidemia     Past Surgical History:  Procedure Laterality Date   tubaligation      Family Psychiatric History: Mother: anxiety, suicidal ideations, depression. Sister: anxiety.  Family History:  Family History  Problem Relation Age of Onset   Rectal cancer Mother 41       stage 4 on chemo   Cervical cancer Mother 20   Colon cancer Neg Hx    Colon polyps Neg Hx    Esophageal cancer Neg Hx    Stomach cancer Neg Hx     Social History:  Social History   Socioeconomic History   Marital status: Single    Spouse name: Not on file   Number of children: Not on file  Years of education: Not on file   Highest education level: Not on file  Occupational History   Not on file  Tobacco Use   Smoking status: Never   Smokeless tobacco: Never  Vaping Use   Vaping status: Never Used  Substance and Sexual Activity   Alcohol use: Never   Drug use: Never   Sexual activity: Not Currently  Other Topics Concern   Not on file  Social History Narrative   Not on file   Social Drivers of Health   Financial Resource Strain: Not on file  Food Insecurity: No Food Insecurity (07/20/2023)   Hunger Vital Sign    Worried About Running Out of Food in the Last Year: Never true    Ran Out of Food in the  Last Year: Never true  Transportation Needs: No Transportation Needs (07/20/2023)   PRAPARE - Administrator, Civil Service (Medical): No    Lack of Transportation (Non-Medical): No  Physical Activity: Not on file  Stress: Not on file  Social Connections: Not on file    Allergies:  Allergies  Allergen Reactions   Escitalopram     Burning sensation     Metabolic Disorder Labs: Lab Results  Component Value Date   HGBA1C 6.3 (H) 07/20/2023   No results found for: "PROLACTIN" Lab Results  Component Value Date   CHOL 257 (H) 07/20/2023   TRIG 206 (H) 07/20/2023   HDL 49 07/20/2023   CHOLHDL 5.2 (H) 07/20/2023   LDLCALC 170 (H) 07/20/2023   LDLCALC 137 (H) 01/05/2021   Lab Results  Component Value Date   TSH 1.030 01/05/2021    Therapeutic Level Labs: No results found for: "LITHIUM" No results found for: "VALPROATE" No results found for: "CBMZ"  Current Medications: Current Outpatient Medications  Medication Sig Dispense Refill   atorvastatin  (LIPITOR) 80 MG tablet Take 1 tablet (80 mg total) by mouth daily. 90 tablet 1   cetirizine  (ZYRTEC  ALLERGY) 10 MG tablet Take 1 tablet (10 mg total) by mouth at bedtime. 30 tablet 2   hydrocortisone  1 % ointment Apply 1 Application topically 2 (two) times daily. 453 g 0   hydrOXYzine  (ATARAX ) 10 MG tablet Take 1 tablet (10 mg total) by mouth 3 (three) times daily as needed. 270 tablet 2   hydrOXYzine  (ATARAX ) 50 MG tablet TAKE 1 TABLET BY MOUTH EVERYDAY AT BEDTIME. NEED PRIMARY INSURANCE 90 tablet 4   promethazine -dextromethorphan (PROMETHAZINE -DM) 6.25-15 MG/5ML syrup Take 5 mLs by mouth at bedtime as needed for cough. (Patient not taking: Reported on 07/20/2023) 118 mL 0   venlafaxine  XR (EFFEXOR -XR) 37.5 MG 24 hr capsule Take 1 capsule (37.5 mg total) by mouth daily. 90 capsule 3   No current facility-administered medications for this visit.     Musculoskeletal: Strength & Muscle Tone: within normal limits and  Telehealth visit Gait & Station: normal, Telehealth Patient leans: N/A  Psychiatric Specialty Exam: Review of Systems  There were no vitals taken for this visit.There is no height or weight on file to calculate BMI.  General Appearance: Well Groomed  Eye Contact:  Good  Speech:  Clear and Coherent and Normal Rate  Volume:  Normal  Mood:  Euthymic  Affect:  Appropriate and Congruent  Thought Process:  Coherent, Goal Directed, and Linear  Orientation:  Full (Time, Place, and Person)  Thought Content: WDL and Logical   Suicidal Thoughts:  No  Homicidal Thoughts:  No  Memory:  Immediate;   Good Recent;  Good Remote;   Good  Judgement:  Good  Insight:  Good  Psychomotor Activity:  Normal  Concentration:  Concentration: Good and Attention Span: Good  Recall:  Good  Fund of Knowledge: Good  Language: Good  Akathisia:  No  Handed:  Right  AIMS (if indicated): not done  Assets:  Communication Skills Desire for Improvement Financial Resources/Insurance Housing Intimacy Physical Health Social Support Transportation Vocational/Educational  ADL's:  Intact  Cognition: WNL  Sleep:  Good   Screenings: GAD-7    Flowsheet Row Video Visit from 09/09/2023 in [facility] Video Visit from 07/27/2023 in [facility] Video Visit from 05/06/2023 in [facility] Video Visit from 02/02/2023 in [facility] Office Visit from 12/14/2022 in [facility]  Total GAD-7 Score 9 9 13 9 6       PHQ2-9    Flowsheet Row Video Visit from 09/09/2023 in [facility] Video Visit from 07/27/2023 in [facility] Office Visit from 07/20/2023 in [facility] Video Visit from 05/06/2023 in [facility] Video Visit from 02/02/2023 in [facility]   PHQ-2 Total Score 3 0 2 2 1   PHQ-9 Total Score 8 2 5 7 3       Flowsheet Row Video Visit from 09/09/2023 in [facility] Video Visit from 07/27/2023 in [facility] UC from 07/06/2023 in [facility])  C-SSRS RISK CATEGORY No Risk No Risk No Risk        Assessment and Plan: Patient notes that her anxiety and depression are well-managed.  She notes that her itching has improved as well with hydroxyzine .  No medication changes made today.  Patient agreeable to continue medication as prescribed. 1. Generalized anxiety disorder  Continue- hydrOXYzine  (ATARAX ) 50 MG tablet; TAKE 1 TABLET BY MOUTH EVERYDAY AT BEDTIME. NEED PRIMARY INSURANCE  Dispense: 90 tablet; Refill: 4 Continue- venlafaxine  XR (EFFEXOR -XR) 37.5 MG 24 hr capsule; Take 1 capsule (37.5 mg total) by mouth daily.  Dispense: 90 capsule; Refill: 3 Continue- hydrOXYzine  (ATARAX ) 10 MG tablet; Take 1 tablet (10 mg total) by mouth 3 (three) times daily as needed.  Dispense: 90 tablet; Refill: 3  2. Mild depression  Continue- venlafaxine  XR (EFFEXOR -XR) 37.5 MG 24 hr capsule; Take 1 capsule (37.5 mg total) by mouth daily.  Dispense: 90 capsule; Refill: 3  3. Itching (Primary)  Continue - hydrOXYzine  (ATARAX ) 50 MG tablet; TAKE 1 TABLET BY MOUTH EVERYDAY AT BEDTIME. NEED PRIMARY INSURANCE  Dispense: 90 tablet; Refill: 4 Start- hydrOXYzine  (ATARAX ) 10 MG tablet; Take 1 tablet (10 mg total) by mouth 3 (three) times daily as needed.  Dispense: 90 tablet; Refill: 3   Follow-up in 2.5 months   Arlyne Bering, NP 09/09/2023, 9:26 AM

## 2023-09-28 ENCOUNTER — Telehealth (INDEPENDENT_AMBULATORY_CARE_PROVIDER_SITE_OTHER): Payer: Self-pay | Admitting: Primary Care

## 2023-09-28 NOTE — Telephone Encounter (Signed)
 Called pt to confirm appt. Pt will be present.

## 2023-09-29 ENCOUNTER — Encounter (INDEPENDENT_AMBULATORY_CARE_PROVIDER_SITE_OTHER): Payer: Self-pay | Admitting: Primary Care

## 2023-09-29 ENCOUNTER — Ambulatory Visit (INDEPENDENT_AMBULATORY_CARE_PROVIDER_SITE_OTHER): Admitting: Primary Care

## 2023-09-29 VITALS — BP 112/72 | HR 87 | Resp 16 | Wt 217.6 lb

## 2023-09-29 DIAGNOSIS — G44209 Tension-type headache, unspecified, not intractable: Secondary | ICD-10-CM

## 2023-09-29 DIAGNOSIS — M545 Low back pain, unspecified: Secondary | ICD-10-CM

## 2023-09-29 DIAGNOSIS — M542 Cervicalgia: Secondary | ICD-10-CM | POA: Diagnosis not present

## 2023-09-29 DIAGNOSIS — G8929 Other chronic pain: Secondary | ICD-10-CM | POA: Diagnosis not present

## 2023-09-29 NOTE — Progress Notes (Signed)
 [facility]  Sheila Zhang, is a 53 y.o. female  ZOX:[id number]  WJX:[id number]  DOB - [birth year]  Chief Complaint  Patient presents with   Back Pain    Lumbar radiates down to both legs    Neck Pain   Headache       Subjective:   Sheila Zhang is a 53 y.o. female here today for an acute visit.  Back Pain This is a chronic problem. The current episode started more than 1 year ago. The problem occurs intermittently. The problem has been gradually worsening since onset. The pain is present in the lumbar spine. The quality of the pain is described as aching. The pain radiates to the right knee and left thigh. The pain is at a severity of 7/10. The pain is moderate. The pain is Worse during the day. The symptoms are aggravated by bending, sitting, standing and twisting. Stiffness is present In the morning. Associated symptoms include headaches, numbness and paresthesias. Risk factors include menopause, obesity and poor posture. She has tried heat, ice and walking for the symptoms.  Neck Pain  This is a recurrent problem. The current episode started more than 1 year ago. The problem occurs intermittently. The problem has been gradually worsening. The pain is associated with an unknown factor. The pain is present in the anterior neck. The quality of the pain is described as aching. The pain is at a severity of 3/10. The symptoms are aggravated by twisting. The pain is Same all the time. Stiffness is present All day. Associated symptoms include headaches, numbness and photophobia. She has tried nothing for the symptoms. The treatment provided no relief.  Headache  This is a recurrent problem. The current episode started in the past 7 days. The problem occurs intermittently. The problem has been waxing and waning. The pain is located in the Occipital and frontal region. The pain does not radiate. The pain quality is similar to prior headaches. The quality of the pain is  described as pulsating. The pain is at a severity of 10/10. The pain is severe. Associated symptoms include back pain, eye pain, neck pain, numbness and photophobia. The symptoms are aggravated by bright light and emotional stress. She has tried acetaminophen for the symptoms. The treatment provided moderate relief. Her past medical history is significant for migraine headaches and obesity.   No problems updated.  Comprehensive ROS Pertinent positive and negative noted in HPI   Allergies  Allergen Reactions   Escitalopram     Burning sensation    Past Medical History:  Diagnosis Date   Anxiety    Dyslipidemia    Current Outpatient Medications on File Prior to Visit  Medication Sig Dispense Refill   atorvastatin  (LIPITOR) 80 MG tablet Take 1 tablet (80 mg total) by mouth daily. 90 tablet 1   cetirizine  (ZYRTEC  ALLERGY) 10 MG tablet Take 1 tablet (10 mg total) by mouth at bedtime. 30 tablet 2   hydrocortisone  1 % ointment Apply 1 Application topically 2 (two) times daily. 453 g 0   hydrOXYzine  (ATARAX ) 10 MG tablet Take 1 tablet (10 mg total) by mouth 3 (three) times daily as needed. 270 tablet 2   hydrOXYzine  (ATARAX ) 50 MG tablet TAKE 1 TABLET BY MOUTH EVERYDAY AT BEDTIME. NEED PRIMARY INSURANCE 90 tablet 4   promethazine -dextromethorphan (PROMETHAZINE -DM) 6.25-15 MG/5ML syrup Take 5 mLs by mouth at bedtime as needed for cough. (Patient not taking: Reported on 07/20/2023) 118 mL 0   venlafaxine  XR (EFFEXOR -XR) 37.5 MG 24  hr capsule Take 1 capsule (37.5 mg total) by mouth daily. 90 capsule 3   No current facility-administered medications on file prior to visit.   Health Maintenance  Topic Date Due   COVID-19 Vaccine (1) Never done   Colon Cancer Screening  Never done   Zoster (Shingles) Vaccine (1 of 2) 10/19/2023*   Flu Shot  11/11/2023   Mammogram  04/26/2025   Pap with HPV screening  02/10/2026   DTaP/Tdap/Td vaccine (2 - Td or Tdap) 02/11/2031   Hepatitis C Screening  Completed    HIV Screening  Completed   HPV Vaccine  Aged Out   Meningitis B Vaccine  Aged Out  *Topic was postponed. The date shown is not the original due date.    Objective:   Vitals:   09/29/23 1431  BP: 112/72  Pulse: 87  Resp: 16  SpO2: 96%  Weight: 217 lb 9.6 oz (98.7 kg)   BP Readings from Last 3 Encounters:  09/29/23 112/72  07/20/23 122/71  07/06/23 131/84   Physical Exam Vitals reviewed.  Constitutional:      Appearance: She is obese.   Eyes:     Extraocular Movements: Extraocular movements intact.   Neck:     Comments: ROM grinding sound Cardiovascular:     Rate and Rhythm: Normal rate and regular rhythm.  Pulmonary:     Effort: Pulmonary effort is normal.     Breath sounds: Normal breath sounds.  Abdominal:     General: Bowel sounds are normal. There is no distension.     Palpations: Abdomen is soft.  Lymphadenopathy:     Cervical: Cervical adenopathy present.   Skin:    General: Skin is warm and dry.   Neurological:     Mental Status: She is oriented to person, place, and time.   Psychiatric:        Mood and Affect: Mood is depressed.        Speech: Speech normal.        Behavior: Behavior normal.     Comments: Mother has cancer      Assessment & Plan  Tanny was seen today for back pain, neck pain and headache.  Diagnoses and all orders for this visit:  Chronic bilateral low back pain without sciatica -     Ambulatory referral to Orthopedic Surgery  Tension-type headache, not intractable, unspecified chronicity pattern Tylenol resolves   Cervical pain (neck) -     Ambulatory referral to Orthopedic Surgery  Patient have been counseled extensively about nutrition and exercise. Other issues discussed during this visit include: low cholesterol diet, weight control and daily exercise, foot care, annual eye examinations at Ophthalmology, importance of adherence with medications and regular follow-up. We also discussed long term complications of  uncontrolled diabetes and hypertension.   Keep schedule appt  The patient was given clear instructions to go to ER or return to medical center if symptoms don't improve, worsen or new problems develop. The patient verbalized understanding. The patient was told to call to get lab results if they haven't heard anything in the next week.   This note has been created with Education officer, environmental. Any transcriptional errors are unintentional.   Marius Siemens, NP 09/29/2023, 3:02 PM

## 2023-09-29 NOTE — Patient Instructions (Signed)
Headache Management:  Cool Compress. Lie down and place a cool compress on your head.  Avoid headache triggers. If certain foods or odors seem to have triggered your migraines in the past, avoid them. A headache diary might help you identify triggers.  Include physical activity in your daily routine. Try a daily walk or other moderate aerobic exercise.  Manage stress. Find healthy ways to cope with the stressors, such as delegating tasks on your to-do list.  Practice relaxation techniques. Try deep breathing, yoga, massage and visualization.  Eat regularly. Eating regularly scheduled meals and maintaining a healthy diet might help prevent headaches. Also, drink plenty of fluids.  Follow a regular sleep schedule. Sleep deprivation might contribute to headaches Consider biofeedback. With this mind-body technique, you learn to control certain bodily functions -- such as muscle tension, heart rate and blood pressure -- to prevent headaches or reduce headache pain.   

## 2023-10-19 ENCOUNTER — Ambulatory Visit (INDEPENDENT_AMBULATORY_CARE_PROVIDER_SITE_OTHER): Admitting: Primary Care

## 2023-10-19 ENCOUNTER — Ambulatory Visit: Admitting: Nurse Practitioner

## 2023-10-24 ENCOUNTER — Ambulatory Visit: Admitting: Physical Medicine and Rehabilitation

## 2023-11-10 ENCOUNTER — Ambulatory Visit (INDEPENDENT_AMBULATORY_CARE_PROVIDER_SITE_OTHER): Admitting: Primary Care

## 2023-11-24 ENCOUNTER — Telehealth (INDEPENDENT_AMBULATORY_CARE_PROVIDER_SITE_OTHER): Admitting: Physician Assistant

## 2023-11-24 DIAGNOSIS — F411 Generalized anxiety disorder: Secondary | ICD-10-CM | POA: Diagnosis not present

## 2023-11-24 DIAGNOSIS — F32A Depression, unspecified: Secondary | ICD-10-CM | POA: Diagnosis not present

## 2023-11-24 DIAGNOSIS — L299 Pruritus, unspecified: Secondary | ICD-10-CM

## 2023-11-24 MED ORDER — HYDROXYZINE HCL 50 MG PO TABS: ORAL_TABLET | ORAL | 3 refills | Status: DC

## 2023-11-24 MED ORDER — HYDROXYZINE HCL 10 MG PO TABS: 10.0000 mg | ORAL_TABLET | Freq: Three times a day (TID) | ORAL | 2 refills | Status: DC | PRN

## 2023-11-24 MED ORDER — VENLAFAXINE HCL ER 37.5 MG PO CP24: 37.5000 mg | ORAL_CAPSULE | Freq: Every day | ORAL | 3 refills | Status: DC

## 2023-11-24 NOTE — Progress Notes (Signed)
 [facility] MD/PA/NP OP Progress Note  Virtual Visit via Video Note  I connected with Sheila Zhang on 11/24/23 at  3:00 PM EDT by a video enabled telemedicine application and verified that I am speaking with the correct person using two identifiers.  Location: Patient: Home Provider: Clinic   I discussed the limitations of evaluation and management by telemedicine and the availability of in person appointments. The patient expressed understanding and agreed to proceed.  Follow Up Instructions:  I discussed the assessment and treatment plan with the patient. The patient was provided an opportunity to ask questions and all were answered. The patient agreed with the plan and demonstrated an understanding of the instructions.   The patient was advised to call back or seek an in-person evaluation if the symptoms worsen or if the condition fails to improve as anticipated.  I provided 8 minutes of non-face-to-face time during this encounter.  Reginia FORBES Bolster, PA    11/24/2023 3:00 PM Sheila Zhang  MRN:  [id number]  Chief Complaint:  Chief Complaint  Patient presents with   Follow-up   Medication Refill   HPI:   Sheila Zhang is a 53 year old female with a past psychiatric history significant for generalized anxiety disorder and mild depression who presents to [facility] via virtual video visit for follow-up and medication management.  Patient was last seen by Dr. Harl on 09/09/2023.  During her last encounter, patient was being managed on the following psychiatric medications:  Hydroxyzine  50 mg at bedtime Venlafaxine  XR (Effexor  XR) 37.5 mg daily Hydroxyzine  10 mg 3 times daily as needed  Patient presents to the encounter stating that she continues to take her medications regularly and denies experiencing any adverse side effects.  Patient endorses minimal depression.  She reports that she did experience a depressive spell  attributed to her job but states that she is fine now.  Patient denies anxiety.  Patient's current stressors revolve around work.  A PHQ-9 screen was performed with the patient scoring a 1.  A GAD-7 screen was also performed with the patient scoring a 15.  Patient is alert and oriented x 4, calm, cooperative, and fully engaged in conversation during the encounter.  Patient endorses good mood.  Patient exhibits euthymic mood with appropriate affect.  Patient denies suicidal or homicidal ideations.  She further denies auditory or visual hallucinations and does not appear to be responding to internal/external stimuli.  Patient endorses good sleep and receives on average 8 to 9 hours of sleep per night.  Patient endorses good appetite and eats on average 3 meals per day along with a snack.  Patient denies alcohol consumption, tobacco use, or illicit drug use.  Visit Diagnosis:    ICD-10-CM   1. Generalized anxiety disorder  F41.1 venlafaxine  XR (EFFEXOR -XR) 37.5 MG 24 hr capsule    hydrOXYzine  (ATARAX ) 50 MG tablet    hydrOXYzine  (ATARAX ) 10 MG tablet    2. Mild depression  F32.A venlafaxine  XR (EFFEXOR -XR) 37.5 MG 24 hr capsule    3. Itching  L29.9 hydrOXYzine  (ATARAX ) 50 MG tablet    hydrOXYzine  (ATARAX ) 10 MG tablet      Past Psychiatric History:  Anxiety  Past Medical History:  Past Medical History:  Diagnosis Date   Anxiety    Dyslipidemia     Past Surgical History:  Procedure Laterality Date   tubaligation      Family Psychiatric History:  Mother - anxiety, suicidal ideations, depression Sister - anxiety  Family History:  Family History  Problem Relation Age of Onset   Rectal cancer Mother 54       stage 4 on chemo   Cervical cancer Mother 106   Colon cancer Neg Hx    Colon polyps Neg Hx    Esophageal cancer Neg Hx    Stomach cancer Neg Hx     Social History:  Social History   Socioeconomic History   Marital status: Single    Spouse name: Not on file   Number of  children: Not on file   Years of education: Not on file   Highest education level: Not on file  Occupational History   Not on file  Tobacco Use   Smoking status: Never   Smokeless tobacco: Never  Vaping Use   Vaping status: Never Used  Substance and Sexual Activity   Alcohol use: Never   Drug use: Never   Sexual activity: Not Currently  Other Topics Concern   Not on file  Social History Narrative   Not on file   Social Drivers of Health   Financial Resource Strain: Not on file  Food Insecurity: No Food Insecurity (07/20/2023)   Hunger Vital Sign    Worried About Running Out of Food in the Last Year: Never true    Ran Out of Food in the Last Year: Never true  Transportation Needs: No Transportation Needs (07/20/2023)   PRAPARE - Administrator, Civil Service (Medical): No    Lack of Transportation (Non-Medical): No  Physical Activity: Not on file  Stress: Not on file  Social Connections: Not on file    Allergies:  Allergies  Allergen Reactions   Escitalopram     Burning sensation     Metabolic Disorder Labs: Lab Results  Component Value Date   HGBA1C 6.3 (H) 07/20/2023   No results found for: PROLACTIN Lab Results  Component Value Date   CHOL 257 (H) 07/20/2023   TRIG 206 (H) 07/20/2023   HDL 49 07/20/2023   CHOLHDL 5.2 (H) 07/20/2023   LDLCALC 170 (H) 07/20/2023   LDLCALC 137 (H) 01/05/2021   Lab Results  Component Value Date   TSH 1.030 01/05/2021    Therapeutic Level Labs: No results found for: LITHIUM No results found for: VALPROATE No results found for: CBMZ  Current Medications: Current Outpatient Medications  Medication Sig Dispense Refill   atorvastatin  (LIPITOR) 80 MG tablet Take 1 tablet (80 mg total) by mouth daily. 90 tablet 1   cetirizine  (ZYRTEC  ALLERGY) 10 MG tablet Take 1 tablet (10 mg total) by mouth at bedtime. 30 tablet 2   hydrocortisone  1 % ointment Apply 1 Application topically 2 (two) times daily. 453 g 0    hydrOXYzine  (ATARAX ) 10 MG tablet Take 1 tablet (10 mg total) by mouth 3 (three) times daily as needed. 270 tablet 2   hydrOXYzine  (ATARAX ) 50 MG tablet TAKE 1 TABLET BY MOUTH EVERYDAY AT BEDTIME. NEED PRIMARY INSURANCE 90 tablet 3   promethazine -dextromethorphan (PROMETHAZINE -DM) 6.25-15 MG/5ML syrup Take 5 mLs by mouth at bedtime as needed for cough. (Patient not taking: Reported on 07/20/2023) 118 mL 0   venlafaxine  XR (EFFEXOR -XR) 37.5 MG 24 hr capsule Take 1 capsule (37.5 mg total) by mouth daily. 90 capsule 3   No current facility-administered medications for this visit.     Musculoskeletal: Strength & Muscle Tone: within normal limits Gait & Station: normal Patient leans: N/A  Psychiatric Specialty Exam: Review of Systems  Psychiatric/Behavioral:  Negative for decreased concentration,  dysphoric mood, hallucinations, self-injury, sleep disturbance and suicidal ideas. The patient is not nervous/anxious and is not hyperactive.     There were no vitals taken for this visit.There is no height or weight on file to calculate BMI.  General Appearance: Casual  Eye Contact:  Good  Speech:  Clear and Coherent and Normal Rate  Volume:  Normal  Mood:  Euthymic  Affect:  Appropriate  Thought Process:  Coherent, Goal Directed, and Descriptions of Associations: Intact  Orientation:  Full (Time, Place, and Person)  Thought Content: WDL   Suicidal Thoughts:  No  Homicidal Thoughts:  No  Memory:  Immediate;   Good Recent;   Good Remote;   Good  Judgement:  Good  Insight:  Good  Psychomotor Activity:  Normal  Concentration:  Concentration: Good and Attention Span: Good  Recall:  Good  Fund of Knowledge: Good  Language: Good  Akathisia:  No  Handed:  Right  AIMS (if indicated): not done  Assets:  Communication Skills Desire for Improvement Financial Resources/Insurance Housing Intimacy Physical Health Social Support Transportation Vocational/Educational  ADL's:  Intact   Cognition: WNL  Sleep:  Good   Screenings: GAD-7    Flowsheet Row Video Visit from 11/24/2023 in [facility] Video Visit from 09/09/2023 in [facility] Video Visit from 07/27/2023 in [facility] Video Visit from 05/06/2023 in [facility] Video Visit from 02/02/2023 in [facility]  Total GAD-7 Score 15 9 9 13 9    PHQ2-9    Flowsheet Row Video Visit from 11/24/2023 in [facility] Video Visit from 09/09/2023 in [facility] Video Visit from 07/27/2023 in [facility] Office Visit from 07/20/2023 in [facility] Video Visit from 05/06/2023 in [facility]  PHQ-2 Total Score 1 3 0 2 2  PHQ-9 Total Score -- 8 2 5 7    Flowsheet Row Video Visit from 11/24/2023 in [facility] Video Visit from 09/09/2023 in [facility] Video Visit from 07/27/2023 in [facility]  C-SSRS RISK CATEGORY No Risk No Risk No Risk     Assessment and Plan:   Linnette Panella is a 53 year old female with a past psychiatric history significant for generalized anxiety disorder and mild depression who presents to [facility] via virtual video visit for follow-up and medication management.  Patient presents to the encounter stating that she continues to take her medications regularly and denies experiencing any adverse side effects.  Patient endorses minimal depression and denies anxiety.  Despite patient denying anxiety, a GAD-7 screen was performed with the patient scoring a 15.  A PHQ-9 screen was performed with the patient scoring a 1.  Patient endorses stability on her current medication regimen and would like to continue taking  her medications as prescribed.  Patient's medications to be e-prescribed to pharmacy of choice.  A [place] Suicide Severity Rating Scale was performed with the patient being considered no risk.  Patient denies suicidal ideations and is able to contract for safety at this time.    Collaboration of Care: Collaboration of Care: Medication Management AEB provider managing patient's psychiatric medications, Primary Care Provider AEB patient being followed by family medicine, and Psychiatrist AEB patient being followed by a mental health provider at this facility  Patient/Guardian was advised Release of Information must  be obtained prior to any record release in order to collaborate their care with an outside provider. Patient/Guardian was advised if they have not already done so to contact the registration department to sign all necessary forms in order for us  to release information regarding their care.   Consent: Patient/Guardian gives verbal consent for treatment and assignment of benefits for services provided during this visit. Patient/Guardian expressed understanding and agreed to proceed.   1. Generalized anxiety disorder  - venlafaxine  XR (EFFEXOR -XR) 37.5 MG 24 hr capsule; Take 1 capsule (37.5 mg total) by mouth daily.  Dispense: 90 capsule; Refill: 3 - hydrOXYzine  (ATARAX ) 50 MG tablet; TAKE 1 TABLET BY MOUTH EVERYDAY AT BEDTIME. NEED PRIMARY INSURANCE  Dispense: 90 tablet; Refill: 3 - hydrOXYzine  (ATARAX ) 10 MG tablet; Take 1 tablet (10 mg total) by mouth 3 (three) times daily as needed.  Dispense: 270 tablet; Refill: 2  2. Mild depression  - venlafaxine  XR (EFFEXOR -XR) 37.5 MG 24 hr capsule; Take 1 capsule (37.5 mg total) by mouth daily.  Dispense: 90 capsule; Refill: 3  3. Itching  - hydrOXYzine  (ATARAX ) 50 MG tablet; TAKE 1 TABLET BY MOUTH EVERYDAY AT BEDTIME. NEED PRIMARY INSURANCE  Dispense: 90 tablet; Refill: 3 - hydrOXYzine  (ATARAX ) 10 MG tablet; Take 1 tablet (10 mg total) by  mouth 3 (three) times daily as needed.  Dispense: 270 tablet; Refill: 2  Patient to follow-up in 2 months Provider spent a total of 8 minutes with the patient/reviewing patient's chart  Reginia FORBES Bolster, PA 11/24/2023, 3:00 PM

## 2023-12-03 ENCOUNTER — Encounter (HOSPITAL_COMMUNITY): Payer: Self-pay | Admitting: Physician Assistant

## 2023-12-05 ENCOUNTER — Telehealth (INDEPENDENT_AMBULATORY_CARE_PROVIDER_SITE_OTHER): Payer: Self-pay | Admitting: Primary Care

## 2023-12-05 NOTE — Telephone Encounter (Unsigned)
 Copied from CRM [id number]. Topic: General - Other >> Dec 05, 2023 11:45 AM Tiffini S wrote: Reason for CRM: Patient called asking for call back about service and emotional support for her dogs- she have two dogs and need information before appointment on 12/26/23- please call at [phone].

## 2023-12-06 NOTE — Telephone Encounter (Signed)
 Spoke with patient advised patient the she would need to speak with her apartment complex's property manager to see what the requirements are for her to have a support dog at her apartment and go from there. Patient voiced that she would and get back with us  once she gets that information.

## 2023-12-06 NOTE — Telephone Encounter (Unsigned)
 Copied from CRM #[id number]. Topic: General - Other >> Dec 06, 2023 10:13 AM Delon DASEN wrote: Reason for CRM: Property manager states the doctor has a form that needs to be filled out for service dogs/emotional support- [phone]

## 2023-12-14 NOTE — Telephone Encounter (Signed)
 Pt called back for update on paperwork, she says she needs this as soon as possible because they gave her a time frame of 5-7 days last week.

## 2023-12-15 ENCOUNTER — Encounter (INDEPENDENT_AMBULATORY_CARE_PROVIDER_SITE_OTHER): Payer: Self-pay | Admitting: Primary Care

## 2023-12-15 ENCOUNTER — Telehealth (INDEPENDENT_AMBULATORY_CARE_PROVIDER_SITE_OTHER): Payer: Self-pay | Admitting: Primary Care

## 2023-12-15 NOTE — Telephone Encounter (Signed)
 Sent letter

## 2023-12-15 NOTE — Telephone Encounter (Signed)
 Copied from CRM #[id number]. Topic: General - Call Back - No Documentation >> Dec 15, 2023  2:27 PM Sheila Zhang wrote: Reason for CRM: patient calling to check the status of her paperwork that she dropped off at the office about her emotional support dog, patient called yesterday to inform the office that her leasing gave her 5-7 days, patient would like to ask for a time estimate of receiving the paper work back   Pt num [phone] (M)

## 2023-12-22 ENCOUNTER — Encounter: Payer: Self-pay | Admitting: *Deleted

## 2023-12-22 ENCOUNTER — Other Ambulatory Visit: Payer: Self-pay

## 2023-12-22 ENCOUNTER — Ambulatory Visit
Admission: EM | Admit: 2023-12-22 | Discharge: 2023-12-22 | Disposition: A | Discharge status: Home / Self Care | Attending: Nurse Practitioner | Admitting: Nurse Practitioner

## 2023-12-22 DIAGNOSIS — U071 COVID-19: Secondary | ICD-10-CM

## 2023-12-22 LAB — POC COVID19/FLU A&B COMBO
Covid Antigen, POC: POSITIVE — AB
Influenza A Antigen, POC: NEGATIVE
Influenza B Antigen, POC: NEGATIVE

## 2023-12-22 MED ORDER — PSEUDOEPH-BROMPHEN-DM 30-2-10 MG/5ML PO SYRP: 10.0000 mL | ORAL_SOLUTION | Freq: Four times a day (QID) | ORAL | 0 refills | Status: AC | PRN

## 2023-12-22 MED ORDER — PAXLOVID (300/100) 20 X 150 MG & 10 X 100MG PO TBPK: 3.0000 | ORAL_TABLET | Freq: Two times a day (BID) | ORAL | 0 refills | Status: AC

## 2023-12-22 NOTE — Discharge Instructions (Addendum)
 You have tested positive for COVID-19. COVID is a viral infection, and antibiotics are not used to treat viral illnesses because they are designed to kill bacteria, not viruses.   You have been prescribed a medication called Paxlovid , which is an antiviral used to treat mild to moderate cases of COVID-19. It is important to take Paxlovid  exactly as prescribed and not to skip any doses. Some people may experience side effects such as a change in taste, diarrhea, high blood pressure, or muscle aches. These side effects are usually mild and go away after the treatment is complete. Take tylenol  and/or ibuprofen  as needed for fevers, headache and body aches. Be sure to drink plenty of fluids and get plenty of rest while you recover.   COVID-19 generally appears to be less severe now compared to earlier stages of the pandemic back in 2020. This is likely due to a combination of factors, including increased population immunity from vaccination and prior infections, as well as the evolution of the virus towards less virulent strains. While new variants continue to emerge, they generally cause milder illness, especially in individuals with prior immunity.  Because of this, the CDC has updated its isolation guidance for COVID-19 and states that a 5-day isolation period following a positive test result is no longer needed. Under the new guidelines, people will not need to isolate if they are fever-free for at least 24 hours without medication and if their symptoms are mild and improving. You may return to normal activities if your symptoms are overall improving and you have been without a fever for 24 hours without taking fever reducing medications like Tylenol  or ibuprofen .   Monitor your symptoms closely over the next several days. If your symptoms are not improving within a week, follow up with your primary care provider.  Go to the emergency department right away if you develop shortness of breath, chest pain,  dizziness, fainting, confusion, severe weakness, or if you are unable to keep fluids down, as these may be signs of more serious illness.

## 2023-12-22 NOTE — ED Triage Notes (Signed)
 Pt reports cough started yesterday and she thought it was allergies so took her allergy meds without relief. Today she has headache, body aches, sore throat. Denies known fever but states I feel hot. States her nephew has covid and she was at a game with him last week.

## 2023-12-22 NOTE — ED Provider Notes (Signed)
 [facility]    CSN: [id number] Arrival date & time: 12/22/23  1746      History   Chief Complaint Chief Complaint  Patient presents with   Cough    HPI Sheila Zhang is a 53 y.o. female.   Discussed the use of AI scribe software for clinical note transcription with the patient, who gave verbal consent to proceed.   Patient presents headache, body aches, sore throat, nasal congestion, rhinitis, sneezing, and cough that started yesterday. The patient's temperature was 100.3 when checked at the clinic, but it was 98 at home. The patient experiences chills but denies wheezing, shortness of breath, or diarrhea. Tylenol was taken for the headache but provided no relief. Patient has had COVID exposure, her nephew has it and she was with him last week. The patient does not smoke or vape.  The following sections of the patient's history were reviewed and updated as appropriate: allergies, current medications, past family history, past medical history, past social history, past surgical history, and problem list.      Past Medical History:  Diagnosis Date   Anxiety    Dyslipidemia     Patient Active Problem List   Diagnosis Date Noted   Lateral epicondylitis, left elbow 05/12/2021   Generalized anxiety disorder 03/14/2021    Past Surgical History:  Procedure Laterality Date   tubaligation      OB History   No obstetric history on file.      Home Medications    Prior to Admission medications   Medication Sig Start Date End Date Taking? Authorizing Provider  atorvastatin  (LIPITOR) 80 MG tablet Take 1 tablet (80 mg total) by mouth daily. 07/22/23  Yes Celestia Rosaline SQUIBB, NP  brompheniramine-pseudoephedrine-DM 30-2-10 MG/5ML syrup Take 10 mLs by mouth every 6 (six) hours as needed (cough and congestion). 12/22/23  Yes Iola Lukes, FNP  cetirizine  (ZYRTEC  ALLERGY) 10 MG tablet Take 1 tablet (10 mg total) by mouth at bedtime. 03/17/23 12/22/23 Yes  Celestia Rosaline SQUIBB, NP  hydrOXYzine  (ATARAX ) 10 MG tablet Take 1 tablet (10 mg total) by mouth 3 (three) times daily as needed. 11/24/23  Yes Nwoko, Uchenna E, PA  hydrOXYzine  (ATARAX ) 50 MG tablet TAKE 1 TABLET BY MOUTH EVERYDAY AT BEDTIME. NEED PRIMARY INSURANCE 11/24/23  Yes Nwoko, Uchenna E, PA  nirmatrelvir /ritonavir  (PAXLOVID , 300/100,) 20 x 150 MG & 10 x 100MG  TBPK Take 3 tablets by mouth 2 (two) times daily for 5 days. Patient GFR is 91 Take nirmatrelvir  (150 mg) two tablets twice daily for 5 days and ritonavir  (100 mg) one tablet twice daily for 5 days. 12/22/23 12/27/23 Yes Trenita Hulme, FNP  venlafaxine  XR (EFFEXOR -XR) 37.5 MG 24 hr capsule Take 1 capsule (37.5 mg total) by mouth daily. 11/24/23  Yes Nwoko, Uchenna E, PA  hydrocortisone  1 % ointment Apply 1 Application topically 2 (two) times daily. Patient not taking: Reported on 12/22/2023 07/20/23   Celestia Rosaline SQUIBB, NP    Family History Family History  Problem Relation Age of Onset   Rectal cancer Mother 43       stage 4 on chemo   Cervical cancer Mother 20   Colon cancer Neg Hx    Colon polyps Neg Hx    Esophageal cancer Neg Hx    Stomach cancer Neg Hx     Social History Social History   Tobacco Use   Smoking status: Never   Smokeless tobacco: Never  Vaping Use   Vaping status: Never Used  Substance  Use Topics   Alcohol use: Never   Drug use: Never     Allergies   Escitalopram   Review of Systems Review of Systems  Constitutional:  Positive for chills. Negative for fever.  HENT:  Positive for congestion, rhinorrhea, sneezing and sore throat.   Respiratory:  Positive for cough. Negative for shortness of breath and wheezing.   Gastrointestinal:  Negative for diarrhea, nausea and vomiting.  Musculoskeletal:  Positive for myalgias.  Neurological:  Positive for headaches.  All other systems reviewed and are negative.    Physical Exam Triage Vital Signs ED Triage Vitals  Encounter Vitals Group     BP  12/22/23 1842 124/82     Girls Systolic BP Percentile --      Girls Diastolic BP Percentile --      Boys Systolic BP Percentile --      Boys Diastolic BP Percentile --      Pulse Rate 12/22/23 1842 (!) 114     Resp 12/22/23 1842 20     Temp 12/22/23 1842 100.3 F (37.9 C)     Temp Source 12/22/23 1842 Oral     SpO2 12/22/23 1842 94 %     Weight --      Height --      Head Circumference --      Peak Flow --      Pain Score 12/22/23 1839 5     Pain Loc --      Pain Education --      Exclude from Growth Chart --    No data found.  Updated Vital Signs BP 124/82 (BP Location: Left Arm)   Pulse (!) 114   Temp 100.3 F (37.9 C) (Oral)   Resp 20   SpO2 94%   Visual Acuity Right Eye Distance:   Left Eye Distance:   Bilateral Distance:    Right Eye Near:   Left Eye Near:    Bilateral Near:     Physical Exam Vitals reviewed.  Constitutional:      General: She is awake. She is not in acute distress.    Appearance: Normal appearance. She is well-developed. She is not ill-appearing, toxic-appearing or diaphoretic.  HENT:     Head: Normocephalic.     Right Ear: Tympanic membrane, ear canal and external ear normal. No drainage, swelling or tenderness. No middle ear effusion. Tympanic membrane is not erythematous.     Left Ear: Tympanic membrane, ear canal and external ear normal. No drainage, swelling or tenderness.  No middle ear effusion. Tympanic membrane is not erythematous.     Nose: Congestion present. No rhinorrhea.     Mouth/Throat:     Lips: Pink.     Mouth: Mucous membranes are moist.     Pharynx: No pharyngeal swelling, oropharyngeal exudate, posterior oropharyngeal erythema or uvula swelling.     Tonsils: No tonsillar exudate or tonsillar abscesses.  Eyes:     General: Vision grossly intact.     Conjunctiva/sclera: Conjunctivae normal.  Cardiovascular:     Rate and Rhythm: Normal rate.     Heart sounds: Normal heart sounds.  Pulmonary:     Effort: Pulmonary  effort is normal. No tachypnea or respiratory distress.     Breath sounds: Normal breath sounds and air entry.  Musculoskeletal:        General: Normal range of motion.     Cervical back: Normal range of motion and neck supple.  Lymphadenopathy:     Cervical: No cervical  adenopathy.  Skin:    General: Skin is warm and dry.  Neurological:     General: No focal deficit present.     Mental Status: She is alert and oriented to person, place, and time.  Psychiatric:        Behavior: Behavior is cooperative.      UC Treatments / Results  Labs (all labs ordered are listed, but only abnormal results are displayed) Labs Reviewed  POC COVID19/FLU A&B COMBO - Abnormal; Notable for the following components:      Result Value   Covid Antigen, POC Positive (*)    All other components within normal limits    EKG   Radiology No results found.  Procedures Procedures (including critical care time)  Medications Ordered in UC Medications - No data to display  Initial Impression / Assessment and Plan / UC Course  I have reviewed the triage vital signs and the nursing notes.  Pertinent labs & imaging results that were available during my care of the patient were reviewed by me and considered in my medical decision making (see chart for details).    The patient presents with symptoms of an acute respiratory illness and tested positive for COVID-19. Paxlovid  prescribed given history of HLD. She has ran out of her medications and hasn't taken in the past three days. She was advised to hold the statin until after completing the Paxlovid . The patient should maintain hydration and get adequate rest. Education was provided regarding the current understanding of COVID-19, which has generally become less severe compared to earlier stages of the pandemic due to increased immunity and viral evolution. The patient was counseled on updated CDC guidance, which no longer requires a strict five-day isolation  period. Instead, return to normal activities is appropriate once symptoms are improving overall and the patient has been fever-free for at least 24 hours without fever-reducing medications. Close monitoring of symptoms over the next several days was advised, with follow-up with primary care if symptoms are not improving within a week. The patient should seek emergency care for shortness of breath, chest pain, dizziness, fainting, confusion, severe weakness, or inability to keep fluids down.  Today's evaluation has revealed no signs of a dangerous process. Discussed diagnosis with patient and/or guardian. Patient and/or guardian aware of their diagnosis, possible red flag symptoms to watch out for and need for close follow up. Patient and/or guardian understands verbal and written discharge instructions. Patient and/or guardian comfortable with plan and disposition.  Patient and/or guardian has a clear mental status at this time, good insight into illness (after discussion and teaching) and has clear judgment to make decisions regarding their care  Documentation was completed with the aid of voice recognition software. Transcription may contain typographical errors.  Final Clinical Impressions(s) / UC Diagnoses   Final diagnoses:  COVID-19     Discharge Instructions      You have tested positive for COVID-19. COVID is a viral infection, and antibiotics are not used to treat viral illnesses because they are designed to kill bacteria, not viruses.   You have been prescribed a medication called Paxlovid , which is an antiviral used to treat mild to moderate cases of COVID-19. It is important to take Paxlovid  exactly as prescribed and not to skip any doses. Some people may experience side effects such as a change in taste, diarrhea, high blood pressure, or muscle aches. These side effects are usually mild and go away after the treatment is complete. Take tylenol and/or ibuprofen   as needed for fevers,  headache and body aches. Be sure to drink plenty of fluids and get plenty of rest while you recover.   COVID-19 generally appears to be less severe now compared to earlier stages of the pandemic back in 2020. This is likely due to a combination of factors, including increased population immunity from vaccination and prior infections, as well as the evolution of the virus towards less virulent strains. While new variants continue to emerge, they generally cause milder illness, especially in individuals with prior immunity.  Because of this, the CDC has updated its isolation guidance for COVID-19 and states that a 5-day isolation period following a positive test result is no longer needed. Under the new guidelines, people will not need to isolate if they are fever-free for at least 24 hours without medication and if their symptoms are mild and improving. You may return to normal activities if your symptoms are overall improving and you have been without a fever for 24 hours without taking fever reducing medications like Tylenol or ibuprofen .   Monitor your symptoms closely over the next several days. If your symptoms are not improving within a week, follow up with your primary care provider.  Go to the emergency department right away if you develop shortness of breath, chest pain, dizziness, fainting, confusion, severe weakness, or if you are unable to keep fluids down, as these may be signs of more serious illness.       ED Prescriptions     Medication Sig Dispense Auth. Provider   nirmatrelvir /ritonavir  (PAXLOVID , 300/100,) 20 x 150 MG & 10 x 100MG  TBPK Take 3 tablets by mouth 2 (two) times daily for 5 days. Patient GFR is 91 Take nirmatrelvir  (150 mg) two tablets twice daily for 5 days and ritonavir  (100 mg) one tablet twice daily for 5 days. 30 tablet Iola Lukes, FNP   brompheniramine-pseudoephedrine-DM 30-2-10 MG/5ML syrup Take 10 mLs by mouth every 6 (six) hours as needed (cough and  congestion). 120 mL Iola Lukes, FNP      PDMP not reviewed this encounter.   Iola Lukes, [place] 12/22/23 1930

## 2023-12-26 ENCOUNTER — Ambulatory Visit (INDEPENDENT_AMBULATORY_CARE_PROVIDER_SITE_OTHER): Admitting: Primary Care

## 2023-12-26 ENCOUNTER — Telehealth (INDEPENDENT_AMBULATORY_CARE_PROVIDER_SITE_OTHER): Payer: Self-pay | Admitting: Primary Care

## 2023-12-26 NOTE — Telephone Encounter (Signed)
 Called pt to reschedule appt. Provider will not be in office today.

## 2023-12-29 NOTE — Telephone Encounter (Unsigned)
 Copied from CRM #[id number]. Topic: General - Call Back - No Documentation >> Dec 15, 2023  2:27 PM DeAngela L wrote: Reason for CRM: patient calling to check the status of her paperwork that she dropped off at the office about her emotional support dog, patient called yesterday to inform the office that her leasing gave her 5-7 days, patient would like to ask for a time estimate of receiving the paper work back   Pt num [phone] (M) >> Dec 29, 2023 12:17 PM DeAngela L wrote: Patient is calling to ask how can she get additional wording on her letter for her service animal like descriptions to walk the service animal and take her to places outside the home like a store etc, patient is mainly concerned cause the apartment complex is looking for it in writing  Patient is willing to pick up a copy of the letter at the office also  Pt num [phone]

## 2023-12-29 NOTE — Telephone Encounter (Signed)
  Called patient to ask specifically what the apartment complex needed in addition to what was add. She was unable to give specific to verbiage. Encourage to follow up with complex and let PCP know.   Patient verbalized understanding.

## 2023-12-29 NOTE — Telephone Encounter (Signed)
 Called patient to ask specifically what the apartment complex needed in addition to what was add. She was unable to give specific to verbiage. Encourage to follow up with complex and let PCP know.   Patient verbalized understanding.

## 2023-12-29 NOTE — Telephone Encounter (Signed)
 Copied from CRM #[id number]. Topic: General - Call Back - No Documentation >> Dec 15, 2023  2:27 PM DeAngela L wrote: Reason for CRM: patient calling to check the status of her paperwork that she dropped off at the office about her emotional support dog, patient called yesterday to inform the office that her leasing gave her 5-7 days, patient would like to ask for a time estimate of receiving the paper work back   Pt num [phone] (M)

## 2024-01-25 ENCOUNTER — Encounter (HOSPITAL_COMMUNITY): Payer: Self-pay

## 2024-01-25 ENCOUNTER — Telehealth (HOSPITAL_COMMUNITY): Admitting: Psychiatry

## 2024-02-13 ENCOUNTER — Encounter: Payer: Self-pay | Admitting: Radiology

## 2024-03-05 ENCOUNTER — Ambulatory Visit: Payer: Self-pay

## 2024-03-05 NOTE — Telephone Encounter (Signed)
 FYI Only or Action Required?: FYI only for provider: appointment scheduled on 03/22/2024 at 3:10 PM .  Patient was last seen in primary care on 09/29/2023 by Sheila Rosaline SQUIBB, NP.  Called Nurse Triage reporting Breast Pain.  Symptoms began 2 days ago.  Interventions attempted: Rest, hydration, or home remedies.  Symptoms are: unchanged.  Triage Disposition: See PCP Within 2 Weeks  Patient/caregiver understands and will follow disposition?: Yes  Copied from CRM [id number]. Topic: Clinical - Red Word Triage >> Mar 05, 2024 11:59 AM Pinkey ORN wrote: Red Word that prompted transfer to Nurse Triage: Pain In Right Breast Reason for Disposition  [1] Breast pain AND [2] cause is not known  Answer Assessment - Initial Assessment Questions 1. SYMPTOM: What's the main symptom you're concerned about?  (e.g., lump, nipple discharge, pain, rash)     Pain 2. LOCATION: Where is the pain located?     Right breast 3. ONSET: When did right breast pain  start?     03/03/2024 4. PRIOR HISTORY: Do you have any history of prior problems with your breasts? (e.g., breast cancer, breast implant, fibrocystic breast disease)     no 5. CAUSE: What do you think is causing this symptom?     unsure 6. OTHER SYMPTOMS: Do you have any other symptoms? (e.g., breast pain, fever, nipple discharge, redness or rash)     no  Protocols used: Breast Symptoms-A-AH

## 2024-03-06 NOTE — Telephone Encounter (Signed)
 Provider and patient is NOT at this office. We are [employer]- she works for [employer]

## 2024-03-17 ENCOUNTER — Emergency Department (HOSPITAL_COMMUNITY): Admission: EM | Admit: 2024-03-17 | Discharge: 2024-03-17 | Disposition: A | Discharge status: Home / Self Care

## 2024-03-17 ENCOUNTER — Emergency Department (HOSPITAL_COMMUNITY)

## 2024-03-17 ENCOUNTER — Other Ambulatory Visit: Payer: Self-pay

## 2024-03-17 ENCOUNTER — Encounter (HOSPITAL_COMMUNITY): Payer: Self-pay

## 2024-03-17 DIAGNOSIS — G43009 Migraine without aura, not intractable, without status migrainosus: Secondary | ICD-10-CM | POA: Insufficient documentation

## 2024-03-17 LAB — COMPREHENSIVE METABOLIC PANEL WITH GFR
ALT: 31 U/L (ref 0–44)
AST: 28 U/L (ref 15–41)
Albumin: 3.7 g/dL (ref 3.5–5.0)
Alkaline Phosphatase: 104 U/L (ref 38–126)
Anion gap: 8 (ref 5–15)
BUN: 12 mg/dL (ref 6–20)
CO2: 26 mmol/L (ref 22–32)
Calcium: 9.2 mg/dL (ref 8.9–10.3)
Chloride: 104 mmol/L (ref 98–111)
Creatinine, Ser: 0.7 mg/dL (ref 0.44–1.00)
GFR, Estimated: >60 mL/min (ref >60)
Glucose, Bld: 104 mg/dL — ABNORMAL HIGH (ref 70–99)
Potassium: 4 mmol/L (ref 3.5–5.1)
Sodium: 138 mmol/L (ref 135–145)
Total Bilirubin: 0.2 mg/dL (ref 0.0–1.2)
Total Protein: 7.6 g/dL (ref 6.5–8.1)

## 2024-03-17 LAB — CBC
HCT: 40.8 % (ref 36.0–46.0)
Hemoglobin: 13.3 g/dL (ref 12.0–15.0)
MCH: 28.8 pg (ref 26.0–34.0)
MCHC: 32.6 g/dL (ref 30.0–36.0)
MCV: 88.3 fL (ref 80.0–100.0)
Platelets: 335 K/uL (ref 150–400)
RBC: 4.62 MIL/uL (ref 3.87–5.11)
RDW: 14.5 % (ref 11.5–15.5)
WBC: 9 K/uL (ref 4.0–10.5)
nRBC: 0 % (ref 0.0–0.2)

## 2024-03-17 MED ORDER — DIPHENHYDRAMINE HCL 50 MG/ML IJ SOLN
25.0000 mg | Freq: Once | INTRAMUSCULAR | Status: AC
  Administered 2024-03-17: 25 mg via INTRAVENOUS
  Filled 2024-03-17: qty 1

## 2024-03-17 MED ORDER — PROCHLORPERAZINE EDISYLATE 10 MG/2ML IJ SOLN
5.0000 mg | Freq: Once | INTRAMUSCULAR | Status: AC
  Administered 2024-03-17: 5 mg via INTRAVENOUS
  Filled 2024-03-17: qty 2

## 2024-03-17 MED ORDER — KETOROLAC TROMETHAMINE 15 MG/ML IJ SOLN
15.0000 mg | Freq: Once | INTRAMUSCULAR | Status: AC
  Administered 2024-03-17: 15 mg via INTRAVENOUS
  Filled 2024-03-17: qty 1

## 2024-03-17 MED ORDER — SODIUM CHLORIDE 0.9 % IV BOLUS
1000.0000 mL | Freq: Once | INTRAVENOUS | Status: AC
  Administered 2024-03-17: 1000 mL via INTRAVENOUS

## 2024-03-17 NOTE — ED Triage Notes (Signed)
 Denies blurred vision.

## 2024-03-17 NOTE — Discharge Instructions (Addendum)
 If you continue to have headaches, you can purchase over-the-counter Excedrin.  This is good medication to take for these type of headaches  Please follow-up with your PCP regarding your ongoing headaches.  Please give neurology call to establish care with them given your ongoing headaches as well   If you have any, vision changes, fever, chills, numbness, weakness please come back to the ED immediately.

## 2024-03-17 NOTE — ED Triage Notes (Signed)
 PT states frequent headaches.  States last night she experienced numbness to lips and tongue (not unilateral).

## 2024-03-17 NOTE — ED Provider Notes (Signed)
 [place] [facility] AT [facility] Provider Note   CSN: [id number] Arrival date & time: 03/17/24  1145     Patient presents with: Headache   Sheila Zhang is a 53 y.o. female.    Headache  Patient presents because of headache.  Patient says she has a long history of headaches.  Tried Tylenol which did help but subsequently still having a headache.  Patient states she woke up with it this morning.  More left-sided of her head.  Patient states that is consistent for previous headaches in the past.  Some light sensitivity and some sound sensitivity but no fever no chills.  No neck pain.  Patient states she has frequent movement of the head without any kind of pain in the neck.  No sick contacts.  No vision changes.  No diplopia.  Patient states there is nothing different about this current headache compared to her baseline no recent falls    Previous medical history reviewed : Last seen in the ED in September 2025.  Present because of cough.  Negative workup.   Prior to Admission medications   Medication Sig Start Date End Date Taking? Authorizing Provider  atorvastatin  (LIPITOR) 80 MG tablet Take 1 tablet (80 mg total) by mouth daily. 07/22/23   Celestia Rosaline SQUIBB, NP  brompheniramine-pseudoephedrine-DM 30-2-10 MG/5ML syrup Take 10 mLs by mouth every 6 (six) hours as needed (cough and congestion). 12/22/23   Iola Lukes, FNP  cetirizine  (ZYRTEC  ALLERGY) 10 MG tablet Take 1 tablet (10 mg total) by mouth at bedtime. 03/17/23 12/22/23  Celestia Rosaline SQUIBB, NP  hydrocortisone  1 % ointment Apply 1 Application topically 2 (two) times daily. Patient not taking: Reported on 12/22/2023 07/20/23   Celestia Rosaline SQUIBB, NP  hydrOXYzine  (ATARAX ) 10 MG tablet Take 1 tablet (10 mg total) by mouth 3 (three) times daily as needed. 11/24/23   Nwoko, Uchenna E, PA  hydrOXYzine  (ATARAX ) 50 MG tablet TAKE 1 TABLET BY MOUTH EVERYDAY AT BEDTIME. NEED PRIMARY INSURANCE 11/24/23    Nwoko, Uchenna E, PA  venlafaxine  XR (EFFEXOR -XR) 37.5 MG 24 hr capsule Take 1 capsule (37.5 mg total) by mouth daily. 11/24/23   Nwoko, Uchenna E, PA    Allergies: Escitalopram    Review of Systems  Neurological:  Positive for headaches.    Updated Vital Signs BP 125/67 (BP Location: Right Arm)   Pulse 84   Temp (!) 97.4 F (36.3 C)   Resp 18   Ht 5' 4 (1.626 m)   Wt 100.2 kg   SpO2 94%   BMI 37.93 kg/m   Physical Exam Vitals and nursing note reviewed.  Constitutional:      General: She is not in acute distress.    Appearance: She is well-developed.  HENT:     Head: Normocephalic and atraumatic.  Eyes:     Conjunctiva/sclera: Conjunctivae normal.  Cardiovascular:     Rate and Rhythm: Normal rate and regular rhythm.     Heart sounds: No murmur heard. Pulmonary:     Effort: Pulmonary effort is normal. No respiratory distress.     Breath sounds: Normal breath sounds.  Abdominal:     Palpations: Abdomen is soft.     Tenderness: There is no abdominal tenderness.  Musculoskeletal:        General: No swelling.     Cervical back: Neck supple.  Skin:    General: Skin is warm and dry.     Capillary Refill: Capillary refill takes less than 2  seconds.  Neurological:     Mental Status: She is alert.  Psychiatric:        Mood and Affect: Mood normal.     (all labs ordered are listed, but only abnormal results are displayed) Labs Reviewed  COMPREHENSIVE METABOLIC PANEL WITH GFR - Abnormal; Notable for the following components:      Result Value   Glucose, Bld 104 (*)    All other components within normal limits  CBC    EKG: None  Radiology: CT Head Wo Contrast Result Date: 03/17/2024 CLINICAL DATA:  Recurrent headaches EXAM: CT HEAD WITHOUT CONTRAST TECHNIQUE: Contiguous axial images were obtained from the base of the skull through the vertex without intravenous contrast. RADIATION DOSE REDUCTION: This exam was performed according to the departmental  dose-optimization program which includes automated exposure control, adjustment of the mA and/or kV according to patient size and/or use of iterative reconstruction technique. COMPARISON:  None Available. FINDINGS: Brain: No acute infarct or hemorrhage. Lateral ventricles and midline structures are unremarkable. No acute extra-axial fluid collections. No mass effect. Vascular: No hyperdense vessel or unexpected calcification. Skull: Normal. Negative for fracture or focal lesion. Sinuses/Orbits: Polypoid mucosal thickening versus mucous retention cyst right maxillary sinus. Other: None. IMPRESSION: 1. No acute intracranial process. Electronically Signed   By: Ozell Daring M.D.   On: 03/17/2024 15:47     Procedures   Medications Ordered in the ED  prochlorperazine  (COMPAZINE ) injection 5 mg (5 mg Intravenous Given 03/17/24 1456)  diphenhydrAMINE  (BENADRYL ) injection 25 mg (25 mg Intravenous Given 03/17/24 1457)  ketorolac  (TORADOL ) 15 MG/ML injection 15 mg (15 mg Intravenous Given 03/17/24 1457)  sodium chloride  0.9 % bolus 1,000 mL (1,000 mLs Intravenous New Bag/Given 03/17/24 1455)                                    Medical Decision Making Amount and/or Complexity of Data Reviewed Labs: ordered. Radiology: ordered.  Risk Prescription drug management.     HPI:    Patient presents because of headache.  Patient says she has a long history of headaches.  Tried Tylenol which did help but subsequently still having a headache.  Patient states she woke up with it this morning.  More left-sided of her head.  Patient states that is consistent for previous headaches in the past.  Some light sensitivity and some sound sensitivity but no fever no chills.  No neck pain.  Patient states she has frequent movement of the head without any kind of pain in the neck.  No sick contacts.  No vision changes.  No diplopia.  Patient states there is nothing different about this current headache compared to her  baseline no recent falls    Previous medical history reviewed : Last seen in the ED in September 2025.  Present because of cough.  Negative workup.  MDM:   Upon exam, patient had open stable.  ANO x 3 GCS 15.  Cranials 2 through 12 intact.  NIH is 0.  Strength and sensation intact in bilateral upper and lower extremities.  Romberg negative.  No concerns for CVA.  Normal chin to chest.  Negative Kernig's Brudzinski's.  No concerns for meningitis encephalitis.  No indication for LP  Will obtain basic laboratory workup.  Will give migraine cocktail of Compazine , Benadryl , Toradol  and reassess as well as a liter of fluid.  Will obtain CT head to rule out Space-occupying lesion  which I think is unlikely.  No concerns for meningitis or encephalitis.  Likely more migrainous versus tension headache.  Low concerns for cluster headache.  No concerns for giant cell temporal arteritis.  No rash on the face that be concerning for any kind of herpes zoster or other herpetic infection  Reevaluation:   Upon reexamination, patient hemodynamically stable.  Remains A&O x 3 with GCS 15.   Laboratory workup unremarkable.  No leukocytosis.  No large electrolyte derangements   Again, no concerns for meningitis or encephalitis  Patient signed out pending CT scan of the head.  If negative, patient will follow-up with neurology as well as PCP outpatient  Interventions: conpazine, benadryl , 1 l ns, toradol ,  Social Determinant of Health: denies drug abuse    Disposition and Follow Up: signout       Final diagnoses:  Migraine without aura and without status migrainosus, not intractable    ED Discharge Orders     None          Simon Lavonia SAILOR, MD 03/17/24 1550

## 2024-03-22 ENCOUNTER — Ambulatory Visit (INDEPENDENT_AMBULATORY_CARE_PROVIDER_SITE_OTHER): Admitting: Primary Care

## 2024-04-02 ENCOUNTER — Encounter (HOSPITAL_COMMUNITY): Payer: Self-pay | Admitting: Psychiatry

## 2024-04-02 ENCOUNTER — Telehealth (INDEPENDENT_AMBULATORY_CARE_PROVIDER_SITE_OTHER): Admitting: Psychiatry

## 2024-04-02 DIAGNOSIS — F32A Depression, unspecified: Secondary | ICD-10-CM | POA: Diagnosis not present

## 2024-04-02 DIAGNOSIS — L299 Pruritus, unspecified: Secondary | ICD-10-CM | POA: Diagnosis not present

## 2024-04-02 DIAGNOSIS — F411 Generalized anxiety disorder: Secondary | ICD-10-CM

## 2024-04-02 MED ORDER — HYDROXYZINE HCL 10 MG PO TABS: 10.0000 mg | ORAL_TABLET | Freq: Three times a day (TID) | ORAL | 2 refills | Status: AC | PRN

## 2024-04-02 MED ORDER — HYDROXYZINE HCL 50 MG PO TABS: ORAL_TABLET | ORAL | 3 refills | Status: AC

## 2024-04-02 MED ORDER — VENLAFAXINE HCL ER 37.5 MG PO CP24: 37.5000 mg | ORAL_CAPSULE | Freq: Every day | ORAL | 3 refills | Status: AC

## 2024-04-02 NOTE — Progress Notes (Signed)
 [facility] MD/PA/NP OP Progress Note Virtual Visit via Video Note  I connected with Sheila Zhang on 04/02/2024 at  3:30 PM EST by a video enabled telemedicine application and verified that I am speaking with the correct person using two identifiers.  Location: Patient: Work Provider: Clinic   I discussed the limitations of evaluation and management by telemedicine and the availability of in person appointments. The patient expressed understanding and agreed to proceed.  I provided 30 minutes of non-face-to-face time during this encounter.    04/02/2024 11:03 AM Sheila Zhang  MRN:  [id number]  Chief Complaint: There is a lot going on  HPI: 53 year old female seen today for follow-up psychiatric evaluation.  She has a psychiatric history of anxiety.  She is currently being managed on Effexor  37.5 mg, hydroxyzine  10 mg 3 times daily as needed, and hydroxyzine  50 mg nightly.  She notes her medications are effective in managing her psychiatric condition.  Today patient was pleasant, cooperative, and engaged in conversation.  She informed clinical research associate that a lot has been going on.  She reports that her mother has stage IV cancer.  She notes that she is overwhelmed at times and she has to assist her mother and takes her to her doctor's appointment.  At times she lacks time for herself.  Patient notes that she now works part-time at her job.  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    Patient reports that the above exacerbates her anxiety and depression.  Today provider conducted a GAD-7 and patient scored 20.  Provider also conducted PHQ-9 and a score of 14.  She endorsed adequate sleep.  Today she denies SI/HI/VAH, mania, paranoia.   Recently patient notes that she has been having migraines.  She has been taking Excedrin which she reports has been effective.  Despite the above stress patient finds her medications effective and request that they not be adjusted.No medication  changes made today.  Patient agreeable to continue medication as prescribed.  Patient was referred to outpatient counseling for therapy.  No other concerns noted at this time.  Visit Diagnosis:    ICD-10-CM   1. Generalized anxiety disorder  F41.1 hydrOXYzine  (ATARAX ) 10 MG tablet    hydrOXYzine  (ATARAX ) 50 MG tablet    venlafaxine  XR (EFFEXOR -XR) 37.5 MG 24 hr capsule    Ambulatory referral to Social Work    2. Itching  L29.9 hydrOXYzine  (ATARAX ) 10 MG tablet    hydrOXYzine  (ATARAX ) 50 MG tablet    3. Mild depression  F32.A venlafaxine  XR (EFFEXOR -XR) 37.5 MG 24 hr capsule    Ambulatory referral to Social Work           Past Psychiatric History: Anxiety  Past Medical History:  Past Medical History:  Diagnosis Date   Anxiety    Dyslipidemia     Past Surgical History:  Procedure Laterality Date   tubaligation      Family Psychiatric History: Mother: anxiety, suicidal ideations, depression. Sister: anxiety.  Family History:  Family History  Problem Relation Age of Onset   Rectal cancer Mother 38       stage 4 on chemo   Cervical cancer Mother 77   Colon cancer Neg Hx    Colon polyps Neg Hx    Esophageal cancer Neg Hx    Stomach cancer Neg Hx     Social History:  Social History   Socioeconomic History   Marital status: Single    Spouse name: Not on file  Number of children: Not on file   Years of education: Not on file   Highest education level: Not on file  Occupational History   Not on file  Tobacco Use   Smoking status: Never   Smokeless tobacco: Never  Vaping Use   Vaping status: Never Used  Substance and Sexual Activity   Alcohol use: Never   Drug use: Never   Sexual activity: Not Currently  Other Topics Concern   Not on file  Social History Narrative   Not on file   Social Drivers of Health   Tobacco Use: Low Risk (04/02/2024)   Patient History    Smoking Tobacco Use: Never    Smokeless Tobacco Use: Never    Passive Exposure: Not on  file  Financial Resource Strain: Not on file  Food Insecurity: No Food Insecurity (07/20/2023)   Hunger Vital Sign    Worried About Running Out of Food in the Last Year: Never true    Ran Out of Food in the Last Year: Never true  Transportation Needs: No Transportation Needs (07/20/2023)   PRAPARE - Administrator, Civil Service (Medical): No    Lack of Transportation (Non-Medical): No  Physical Activity: Not on file  Stress: Not on file  Social Connections: Not on file  Depression (PHQ2-9): High Risk (04/02/2024)   Depression (PHQ2-9)    PHQ-2 Score: 14  Alcohol Screen: Not on file  Housing: Low Risk (07/20/2023)   Housing Stability Vital Sign    Unable to Pay for Housing in the Last Year: No    Number of Times Moved in the Last Year: 0    Homeless in the Last Year: No  Utilities: Not At Risk (07/20/2023)   AHC Utilities    Threatened with loss of utilities: No  Health Literacy: Not on file    Allergies:  Allergies  Allergen Reactions   Escitalopram     Burning sensation     Metabolic Disorder Labs: Lab Results  Component Value Date   HGBA1C 6.3 (H) 07/20/2023   No results found for: PROLACTIN Lab Results  Component Value Date   CHOL 257 (H) 07/20/2023   TRIG 206 (H) 07/20/2023   HDL 49 07/20/2023   CHOLHDL 5.2 (H) 07/20/2023   LDLCALC 170 (H) 07/20/2023   LDLCALC 137 (H) 01/05/2021   Lab Results  Component Value Date   TSH 1.030 01/05/2021    Therapeutic Level Labs: No results found for: LITHIUM No results found for: VALPROATE No results found for: CBMZ  Current Medications: Current Outpatient Medications  Medication Sig Dispense Refill   atorvastatin  (LIPITOR) 80 MG tablet Take 1 tablet (80 mg total) by mouth daily. 90 tablet 1   brompheniramine-pseudoephedrine-DM 30-2-10 MG/5ML syrup Take 10 mLs by mouth every 6 (six) hours as needed (cough and congestion). 120 mL 0   cetirizine  (ZYRTEC  ALLERGY) 10 MG tablet Take 1 tablet (10 mg total)  by mouth at bedtime. 30 tablet 2   hydrocortisone  1 % ointment Apply 1 Application topically 2 (two) times daily. (Patient not taking: Reported on 12/22/2023) 453 g 0   hydrOXYzine  (ATARAX ) 10 MG tablet Take 1 tablet (10 mg total) by mouth 3 (three) times daily as needed. 90 tablet 2   hydrOXYzine  (ATARAX ) 50 MG tablet TAKE 1 TABLET BY MOUTH EVERYDAY AT BEDTIME. NEED PRIMARY INSURANCE 90 tablet 3   venlafaxine  XR (EFFEXOR -XR) 37.5 MG 24 hr capsule Take 1 capsule (37.5 mg total) by mouth daily. 30 capsule 3  No current facility-administered medications for this visit.     Musculoskeletal: Strength & Muscle Tone: within normal limits and Telehealth visit Gait & Station: normal, Telehealth Patient leans: N/A  Psychiatric Specialty Exam: Review of Systems  There were no vitals taken for this visit.There is no height or weight on file to calculate BMI.  General Appearance: Well Groomed  Eye Contact:  Good  Speech:  Clear and Coherent and Normal Rate  Volume:  Normal  Mood:  Anxious and Depressed  Affect:  Appropriate and Congruent  Thought Process:  Coherent, Goal Directed, and Linear  Orientation:  Full (Time, Place, and Person)  Thought Content: WDL and Logical   Suicidal Thoughts:  No  Homicidal Thoughts:  No  Memory:  Immediate;   Good Recent;   Good Remote;   Good  Judgement:  Good  Insight:  Good  Psychomotor Activity:  Normal  Concentration:  Concentration: Good and Attention Span: Good  Recall:  Good  Fund of Knowledge: Good  Language: Good  Akathisia:  No  Handed:  Right  AIMS (if indicated): not done  Assets:  Communication Skills Desire for Improvement Financial Resources/Insurance Housing Intimacy Physical Health Social Support Transportation Vocational/Educational  ADL's:  Intact  Cognition: WNL  Sleep:  Good   Screenings: GAD-7    Flowsheet Row Video Visit from 04/02/2024 in [facility] Video Visit from 11/24/2023 in  [facility] Video Visit from 09/09/2023 in [facility] Video Visit from 07/27/2023 in [facility] Video Visit from 05/06/2023 in [facility]  Total GAD-7 Score 20 15 9 9 13    PHQ2-9    Flowsheet Row Video Visit from 04/02/2024 in [facility] Video Visit from 11[facility]24/2023 in [facility] Video Visit from 09/09/2023 in [facility] Video Visit from 07/27/2023 in [facility] Office Visit from 07/20/2023 in [facility]  PHQ-2 Total Score 6 1 3  0 2  PHQ-9 Total Score 14 -- 8 2 5    Flowsheet Row ED from 03/17/2024 in [facility] UC from 12/22/2023 in [facility]) Video Visit from 11/24/2023 in [facility]  C-SSRS RISK CATEGORY No Risk No Risk No Risk     Assessment and Plan: Patient endorses increased anxiety due to her mother's health and situational stressors. Despite the above stress patient finds her medications effective and request that they not be adjusted.No medication changes made today.  Patient agreeable to continue medication as prescribed.  Patient was referred to outpatient counseling for therapy.   1. Generalized anxiety disorder  Continue- hydrOXYzine  (ATARAX ) 10 MG tablet; Take 1 tablet (10 mg total) by mouth 3 (three) times daily as needed.  Dispense: 90 tablet; Refill: 2 Continue- hydrOXYzine  (ATARAX ) 50 MG tablet; TAKE 1 TABLET BY MOUTH EVERYDAY AT BEDTIME. NEED PRIMARY INSURANCE  Dispense: 90 tablet; Refill: 3 Continue- venlafaxine  XR (EFFEXOR -XR) 37.5 MG 24 hr capsule; Take 1 capsule (37.5 mg total) by mouth daily.  Dispense: 30 capsule; Refill: 3 - Ambulatory referral to Social Work  2. Itching  Continue- hydrOXYzine   (ATARAX ) 10 MG tablet; Take 1 tablet (10 mg total) by mouth 3 (three) times daily as needed.  Dispense: 90 tablet; Refill: 2 Continue- hydrOXYzine  (ATARAX ) 50 MG tablet; TAKE 1 TABLET BY MOUTH EVERYDAY AT BEDTIME. NEED PRIMARY INSURANCE  Dispense: 90 tablet; Refill: 3  3. Mild depression  Continue- venlafaxine  XR (EFFEXOR -XR) 37.5 MG 24 hr capsule; Take 1 capsule (37.5 mg total) by mouth daily.  Dispense: 30 capsule; Refill: 3 - Ambulatory referral to Social Work    Follow-up in 2.5 months   Zane FORBES Bach, NP 04/02/2024, 11:03 AM

## 2024-04-23 ENCOUNTER — Telehealth (INDEPENDENT_AMBULATORY_CARE_PROVIDER_SITE_OTHER): Payer: Self-pay | Admitting: Primary Care

## 2024-04-23 ENCOUNTER — Ambulatory Visit (HOSPITAL_COMMUNITY)

## 2024-04-23 NOTE — Telephone Encounter (Signed)
 Spoke to pt about upcoming appt.. Will be present

## 2024-04-24 ENCOUNTER — Ambulatory Visit (INDEPENDENT_AMBULATORY_CARE_PROVIDER_SITE_OTHER): Payer: Self-pay | Admitting: Primary Care

## 2024-05-15 ENCOUNTER — Inpatient Hospital Stay: Admitting: Primary Care

## 2024-05-18 ENCOUNTER — Inpatient Hospital Stay: Admitting: *Deleted

## 2024-05-23 ENCOUNTER — Inpatient Hospital Stay: Admitting: *Deleted

## 2024-06-08 ENCOUNTER — Ambulatory Visit (HOSPITAL_COMMUNITY)

## 2024-06-14 ENCOUNTER — Encounter (HOSPITAL_COMMUNITY): Admitting: Psychiatry
